# Patient Record
Sex: Female | Born: 1985 | Race: White | Hispanic: No | Marital: Single | Smoking: Current some day smoker
Health system: Southern US, Community
[De-identification: ages and names within clinical notes are randomized; demographics above are authoritative.]

## PROBLEM LIST (undated history)

## (undated) DIAGNOSIS — D689 Coagulation defect, unspecified: Secondary | ICD-10-CM

## (undated) DIAGNOSIS — F419 Anxiety disorder, unspecified: Secondary | ICD-10-CM

## (undated) DIAGNOSIS — A770 Spotted fever due to Rickettsia rickettsii: Secondary | ICD-10-CM

## (undated) DIAGNOSIS — R55 Syncope and collapse: Secondary | ICD-10-CM

## (undated) DIAGNOSIS — R519 Headache, unspecified: Secondary | ICD-10-CM

## (undated) DIAGNOSIS — K759 Inflammatory liver disease, unspecified: Secondary | ICD-10-CM

## (undated) HISTORY — DX: Spotted fever due to Rickettsia rickettsii: A77.0

## (undated) HISTORY — DX: Coagulation defect, unspecified: D68.9

## (undated) HISTORY — DX: Syncope and collapse: R55

## (undated) HISTORY — PX: LIVER BIOPSY: SHX301

---

## 2008-01-21 ENCOUNTER — Other Ambulatory Visit: Payer: Self-pay

## 2008-01-21 ENCOUNTER — Emergency Department: Payer: Self-pay | Admitting: Emergency Medicine

## 2008-03-16 ENCOUNTER — Ambulatory Visit: Payer: Self-pay | Admitting: Internal Medicine

## 2008-03-21 ENCOUNTER — Ambulatory Visit: Payer: Self-pay | Admitting: Family Medicine

## 2008-04-18 ENCOUNTER — Ambulatory Visit: Payer: Self-pay | Admitting: Family Medicine

## 2009-08-29 ENCOUNTER — Ambulatory Visit: Payer: Self-pay | Admitting: Family Medicine

## 2013-03-16 ENCOUNTER — Ambulatory Visit: Payer: Self-pay | Admitting: Family Medicine

## 2013-07-23 ENCOUNTER — Ambulatory Visit: Payer: Self-pay

## 2013-07-23 LAB — DRUG SCREEN, URINE
Benzodiazepine, Ur Scrn: NEGATIVE (ref ?–200)
Cocaine Metabolite,Ur ~~LOC~~: NEGATIVE (ref ?–300)
MDMA (Ecstasy)Ur Screen: NEGATIVE (ref ?–500)
Methadone, Ur Screen: NEGATIVE (ref ?–300)
Opiate, Ur Screen: NEGATIVE (ref ?–300)
Phencyclidine (PCP) Ur S: NEGATIVE (ref ?–25)
Tricyclic, Ur Screen: NEGATIVE (ref ?–1000)

## 2021-09-03 ENCOUNTER — Other Ambulatory Visit: Payer: Self-pay | Admitting: Orthopedic Surgery

## 2021-09-03 DIAGNOSIS — S83511A Sprain of anterior cruciate ligament of right knee, initial encounter: Secondary | ICD-10-CM

## 2021-09-20 ENCOUNTER — Ambulatory Visit
Admission: RE | Admit: 2021-09-20 | Discharge: 2021-09-20 | Disposition: A | Discharge status: Home / Self Care | Payer: Self-pay | Source: Ambulatory Visit | Attending: Orthopedic Surgery | Admitting: Orthopedic Surgery

## 2021-09-20 DIAGNOSIS — S83511A Sprain of anterior cruciate ligament of right knee, initial encounter: Secondary | ICD-10-CM

## 2021-10-23 ENCOUNTER — Other Ambulatory Visit: Payer: Self-pay | Admitting: Orthopedic Surgery

## 2021-11-18 ENCOUNTER — Other Ambulatory Visit: Payer: Self-pay

## 2021-11-18 ENCOUNTER — Other Ambulatory Visit
Admission: RE | Admit: 2021-11-18 | Discharge: 2021-11-18 | Disposition: A | Discharge status: Home / Self Care | Payer: 59 | Source: Ambulatory Visit | Attending: Orthopedic Surgery | Admitting: Orthopedic Surgery

## 2021-11-18 HISTORY — DX: Inflammatory liver disease, unspecified: K75.9

## 2021-11-18 HISTORY — DX: Headache, unspecified: R51.9

## 2021-11-18 HISTORY — DX: Anxiety disorder, unspecified: F41.9

## 2021-11-18 NOTE — Patient Instructions (Addendum)
Your procedure is scheduled on: 11/25/21 - Monday ?Report to the Registration Desk on the 1st floor of the [facility]. ?To find out your arrival time, please call [phone] between 1PM - 3PM on: 11/22/21  ? ?REMEMBER: ?Instructions that are not followed completely may result in serious medical risk, up to and including death; or upon the discretion of your surgeon and anesthesiologist your surgery may need to be rescheduled. ? ?Do not eat food after midnight the night before surgery.  ?No gum chewing, lozengers or hard candies. ? ?You may however, drink CLEAR liquids up to 2 hours before you are scheduled to arrive for your surgery. Do not drink anything within 2 hours of your scheduled arrival time. ? ?Clear liquids include: ?- water  ?- apple juice without pulp ?- gatorade (not RED colors) ?- black coffee or tea (Do NOT add milk or creamers to the coffee or tea) ?Do NOT drink anything that is not on this list ? ?In addition, your doctor has ordered for you to drink the provided  ?Ensure Pre-Surgery Clear Carbohydrate Drink  ?Drinking this carbohydrate drink up to two hours before surgery helps to reduce insulin resistance and improve patient outcomes. Please complete drinking 2 hours prior to scheduled arrival time. ? ?TAKE THESE MEDICATIONS THE MORNING OF SURGERY WITH A SIP OF WATER:  ? ?- escitalopram (LEXAPRO) 5 MG tablet ? ?Stop Anti-inflammatories (NSAIDS) such as Advil, Aleve, Ibuprofen, Motrin, Naproxen, Naprosyn and Aspirin based products such as Excedrin, Goodys Powder, BC Powder. ? ?Stop ANY OVER THE COUNTER supplements until after surgery. ? ?You may take Tylenol if needed for pain up until the day of surgery. ? ?No Alcohol for 24 hours before or after surgery. ? ?No Smoking including e-cigarettes for 24 hours prior to surgery.  ?No chewable tobacco products for at least 6 hours prior to surgery.  ?No nicotine patches on the day of surgery. ? ?Do not use any "recreational" drugs for at least a  week prior to your surgery.  ?Please be advised that the combination of cocaine and anesthesia may have negative outcomes, up to and including death. ?If you test positive for cocaine, your surgery will be cancelled. ? ?On the morning of surgery brush your teeth with toothpaste and water, you may rinse your mouth with mouthwash if you wish. ?Do not swallow any toothpaste or mouthwash. ? ?Use CHG Soap or wipes as directed on instruction sheet. ? ?Do not wear jewelry, make-up, hairpins, clips or nail polish. ? ?Do not wear lotions, powders, or perfumes.  ? ?Do not shave body from the neck down 48 hours prior to surgery just in case you cut yourself which could leave a site for infection.  ?Also, freshly shaved skin may become irritated if using the CHG soap. ? ?Contact lenses, hearing aids and dentures may not be worn into surgery. ? ?Do not bring valuables to the hospital. [facility] is not responsible for any missing/lost belongings or valuables.  ? ?Notify your doctor if there is any change in your medical condition (cold, fever, infection). ? ?Wear comfortable clothing (specific to your surgery type) to the hospital. ? ?After surgery, you can help prevent lung complications by doing breathing exercises.  ?Take deep breaths and cough every 1-2 hours. Your doctor may order a device called an Incentive Spirometer to help you take deep breaths. ?When coughing or sneezing, hold a pillow firmly against your incision with both hands. This is called ?splinting.? Doing this helps protect your incision.  It also decreases belly discomfort. ? ?If you are being admitted to the hospital overnight, leave your suitcase in the car. ?After surgery it may be brought to your room. ? ?If you are being discharged the day of surgery, you will not be allowed to drive home. ?You will need a responsible adult (18 years or older) to drive you home and stay with you that night.  ? ?If you are taking public transportation, you will need to  have a responsible adult (18 years or older) with you. ?Please confirm with your physician that it is acceptable to use public transportation.  ? ?Please call the Pre-admissions Testing Dept. at [phone] if you have any questions about these instructions. ? ?Surgery Visitation Policy: ? ?Patients undergoing a surgery or procedure may have two family members or support persons with them as long as the person is not COVID-19 positive or experiencing its symptoms.  ? ?Inpatient Visitation:   ? ?Visiting hours are 7 a.m. to 8 p.m. ?Up to four visitors are allowed at one time in a patient room, including children. The visitors may rotate out with other people during the day. One designated support person (adult) may remain overnight. ? ?All Areas: ?All visitors must pass COVID-19 screenings, use hand sanitizer when entering and exiting the patient?s room and wear a mask at all times, including in the patient?s room. ?Patients must also wear a mask when staff or their visitor are in the room. ?Masking is required regardless of vaccination status.  ?

## 2021-11-24 MED ORDER — CHLORHEXIDINE GLUCONATE 0.12 % MT SOLN
15.0000 mL | Freq: Once | OROMUCOSAL | Status: AC
  Administered 2021-11-25: 15 mL via OROMUCOSAL

## 2021-11-24 MED ORDER — ORAL CARE MOUTH RINSE: 15.0000 mL | Freq: Once | OROMUCOSAL | Status: AC

## 2021-11-24 MED ORDER — CEFAZOLIN SODIUM-DEXTROSE 2-4 GM/100ML-% IV SOLN
2.0000 g | INTRAVENOUS | Status: AC
  Administered 2021-11-25: 2 g via INTRAVENOUS

## 2021-11-24 MED ORDER — FAMOTIDINE 20 MG PO TABS
20.0000 mg | ORAL_TABLET | Freq: Once | ORAL | Status: AC
  Administered 2021-11-25: 20 mg via ORAL

## 2021-11-24 MED ORDER — LACTATED RINGERS IV SOLN: INTRAVENOUS | Status: DC

## 2021-11-25 ENCOUNTER — Encounter
Admission: RE | Disposition: A | Discharge status: Home / Self Care | Payer: Self-pay | Source: Home / Self Care | Attending: Orthopedic Surgery

## 2021-11-25 ENCOUNTER — Ambulatory Visit: Payer: 59

## 2021-11-25 ENCOUNTER — Ambulatory Visit
Admission: RE | Admit: 2021-11-25 | Discharge: 2021-11-25 | Disposition: A | Discharge status: Home / Self Care | Payer: 59 | Attending: Orthopedic Surgery | Admitting: Orthopedic Surgery

## 2021-11-25 ENCOUNTER — Other Ambulatory Visit: Payer: Self-pay

## 2021-11-25 ENCOUNTER — Ambulatory Visit: Payer: 59 | Admitting: Anesthesiology

## 2021-11-25 ENCOUNTER — Encounter: Payer: Self-pay | Admitting: Orthopedic Surgery

## 2021-11-25 DIAGNOSIS — Z8619 Personal history of other infectious and parasitic diseases: Secondary | ICD-10-CM | POA: Diagnosis not present

## 2021-11-25 DIAGNOSIS — S83511A Sprain of anterior cruciate ligament of right knee, initial encounter: Secondary | ICD-10-CM | POA: Diagnosis present

## 2021-11-25 DIAGNOSIS — X58XXXA Exposure to other specified factors, initial encounter: Secondary | ICD-10-CM | POA: Insufficient documentation

## 2021-11-25 DIAGNOSIS — F419 Anxiety disorder, unspecified: Secondary | ICD-10-CM | POA: Diagnosis not present

## 2021-11-25 DIAGNOSIS — F17200 Nicotine dependence, unspecified, uncomplicated: Secondary | ICD-10-CM | POA: Insufficient documentation

## 2021-11-25 DIAGNOSIS — M898X8 Other specified disorders of bone, other site: Secondary | ICD-10-CM | POA: Insufficient documentation

## 2021-11-25 HISTORY — PX: KNEE ARTHROSCOPY WITH MACI CARTILAGE HARVEST: SHX6875

## 2021-11-25 HISTORY — PX: KNEE ARTHROSCOPY WITH ANTERIOR CRUCIATE LIGAMENT (ACL) REPAIR WITH HAMSTRING GRAFT: SHX5645

## 2021-11-25 LAB — POCT PREGNANCY, URINE: Preg Test, Ur: NEGATIVE

## 2021-11-25 SURGERY — KNEE ARTHROSCOPY WITH ANTERIOR CRUCIATE LIGAMENT (ACL) REPAIR WITH HAMSTRING GRAFT
Anesthesia: General | Laterality: Right

## 2021-11-25 MED ORDER — EPINEPHRINE PF 1 MG/ML IJ SOLN
INTRAMUSCULAR | Status: AC
  Filled 2021-11-25: qty 4

## 2021-11-25 MED ORDER — BUPIVACAINE HCL (PF) 0.5 % IJ SOLN
INTRAMUSCULAR | Status: DC | PRN
  Administered 2021-11-25: 10 mL

## 2021-11-25 MED ORDER — GABAPENTIN 300 MG PO CAPS
ORAL_CAPSULE | ORAL | Status: AC
  Administered 2021-11-25: 600 mg via ORAL
  Filled 2021-11-25: qty 2

## 2021-11-25 MED ORDER — MIDAZOLAM HCL 2 MG/2ML IJ SOLN: 2.0000 mg | Freq: Once | INTRAMUSCULAR | Status: AC

## 2021-11-25 MED ORDER — IBUPROFEN 800 MG PO TABS: 800.0000 mg | ORAL_TABLET | Freq: Three times a day (TID) | ORAL | 1 refills | Status: AC

## 2021-11-25 MED ORDER — FAMOTIDINE 20 MG PO TABS
ORAL_TABLET | ORAL | Status: AC
  Filled 2021-11-25: qty 1

## 2021-11-25 MED ORDER — EPHEDRINE SULFATE (PRESSORS) 50 MG/ML IJ SOLN
INTRAMUSCULAR | Status: DC | PRN
  Administered 2021-11-25: 5 mg via INTRAVENOUS

## 2021-11-25 MED ORDER — ONDANSETRON HCL 4 MG/2ML IJ SOLN
INTRAMUSCULAR | Status: DC | PRN
  Administered 2021-11-25: 4 mg via INTRAVENOUS

## 2021-11-25 MED ORDER — ACETAMINOPHEN 10 MG/ML IV SOLN: 1000.0000 mg | Freq: Once | INTRAVENOUS | Status: DC | PRN

## 2021-11-25 MED ORDER — ACETAMINOPHEN 500 MG PO TABS
ORAL_TABLET | ORAL | Status: AC
  Filled 2021-11-25: qty 2

## 2021-11-25 MED ORDER — MIDAZOLAM HCL 2 MG/2ML IJ SOLN
INTRAMUSCULAR | Status: AC
Start: 2021-11-25 — End: 2021-11-25
  Administered 2021-11-25: 2 mg via INTRAVENOUS
  Filled 2021-11-25: qty 2

## 2021-11-25 MED ORDER — FENTANYL CITRATE (PF) 100 MCG/2ML IJ SOLN
INTRAMUSCULAR | Status: AC
  Filled 2021-11-25: qty 2

## 2021-11-25 MED ORDER — GABAPENTIN 600 MG PO TABS
600.0000 mg | ORAL_TABLET | Freq: Once | ORAL | Status: DC
  Filled 2021-11-25: qty 1

## 2021-11-25 MED ORDER — PROPOFOL 10 MG/ML IV BOLUS
INTRAVENOUS | Status: DC | PRN
  Administered 2021-11-25: 150 mg via INTRAVENOUS

## 2021-11-25 MED ORDER — CELECOXIB 200 MG PO CAPS
200.0000 mg | ORAL_CAPSULE | Freq: Once | ORAL | Status: AC
  Administered 2021-11-25: 200 mg via ORAL

## 2021-11-25 MED ORDER — LIDOCAINE HCL (CARDIAC) PF 100 MG/5ML IV SOSY
PREFILLED_SYRINGE | INTRAVENOUS | Status: DC | PRN
  Administered 2021-11-25: 100 mg via INTRAVENOUS

## 2021-11-25 MED ORDER — OXYCODONE HCL 5 MG PO TABS
ORAL_TABLET | ORAL | Status: AC
  Administered 2021-11-25: 5 mg via ORAL
  Filled 2021-11-25: qty 1

## 2021-11-25 MED ORDER — ONDANSETRON 4 MG PO TBDP: 4.0000 mg | ORAL_TABLET | Freq: Three times a day (TID) | ORAL | 0 refills | Status: DC | PRN

## 2021-11-25 MED ORDER — OXYCODONE HCL 5 MG/5ML PO SOLN: 5.0000 mg | Freq: Once | ORAL | Status: AC | PRN

## 2021-11-25 MED ORDER — FENTANYL CITRATE (PF) 100 MCG/2ML IJ SOLN
INTRAMUSCULAR | Status: DC | PRN
  Administered 2021-11-25 (×2): 50 ug via INTRAVENOUS

## 2021-11-25 MED ORDER — OXYCODONE HCL 5 MG PO TABS: 5.0000 mg | ORAL_TABLET | Freq: Once | ORAL | Status: AC | PRN

## 2021-11-25 MED ORDER — PROMETHAZINE HCL 25 MG/ML IJ SOLN: 6.2500 mg | INTRAMUSCULAR | Status: DC | PRN

## 2021-11-25 MED ORDER — ASPIRIN EC 325 MG PO TBEC: 325.0000 mg | DELAYED_RELEASE_TABLET | Freq: Every day | ORAL | 0 refills | Status: AC

## 2021-11-25 MED ORDER — LIDOCAINE HCL (PF) 1 % IJ SOLN
INTRAMUSCULAR | Status: AC
  Filled 2021-11-25: qty 2

## 2021-11-25 MED ORDER — GABAPENTIN 300 MG PO CAPS: 600.0000 mg | ORAL_CAPSULE | Freq: Once | ORAL | Status: AC

## 2021-11-25 MED ORDER — DIAZEPAM 5 MG PO TABS: 5.0000 mg | ORAL_TABLET | Freq: Three times a day (TID) | ORAL | 0 refills | Status: AC | PRN

## 2021-11-25 MED ORDER — LIDOCAINE HCL (PF) 1 % IJ SOLN
INTRAMUSCULAR | Status: DC | PRN
  Administered 2021-11-25 (×2): 1 mL

## 2021-11-25 MED ORDER — CHLORHEXIDINE GLUCONATE 0.12 % MT SOLN
OROMUCOSAL | Status: AC
  Filled 2021-11-25: qty 15

## 2021-11-25 MED ORDER — CEFAZOLIN SODIUM-DEXTROSE 2-4 GM/100ML-% IV SOLN
INTRAVENOUS | Status: AC
  Filled 2021-11-25: qty 100

## 2021-11-25 MED ORDER — ACETAMINOPHEN 500 MG PO TABS
1000.0000 mg | ORAL_TABLET | Freq: Once | ORAL | Status: AC
  Administered 2021-11-25: 500 mg via ORAL

## 2021-11-25 MED ORDER — OXYCODONE HCL 5 MG PO TABS: 5.0000 mg | ORAL_TABLET | ORAL | 0 refills | Status: DC | PRN

## 2021-11-25 MED ORDER — LIDOCAINE-EPINEPHRINE 1 %-1:100000 IJ SOLN
INTRAMUSCULAR | Status: AC
  Filled 2021-11-25: qty 1

## 2021-11-25 MED ORDER — BUPIVACAINE HCL (PF) 0.5 % IJ SOLN
INTRAMUSCULAR | Status: AC
  Filled 2021-11-25: qty 30

## 2021-11-25 MED ORDER — FENTANYL CITRATE (PF) 100 MCG/2ML IJ SOLN
25.0000 ug | INTRAMUSCULAR | Status: DC | PRN
  Administered 2021-11-25: 50 ug via INTRAVENOUS

## 2021-11-25 MED ORDER — SUGAMMADEX SODIUM 200 MG/2ML IV SOLN
INTRAVENOUS | Status: DC | PRN
  Administered 2021-11-25: 200 mg via INTRAVENOUS

## 2021-11-25 MED ORDER — VANCOMYCIN HCL 1000 MG IV SOLR
INTRAVENOUS | Status: DC | PRN
  Administered 2021-11-25: 1000 mg

## 2021-11-25 MED ORDER — ROCURONIUM BROMIDE 100 MG/10ML IV SOLN
INTRAVENOUS | Status: DC | PRN
  Administered 2021-11-25: 20 mg via INTRAVENOUS
  Administered 2021-11-25: 50 mg via INTRAVENOUS

## 2021-11-25 MED ORDER — EPINEPHRINE PF 1 MG/ML IJ SOLN
INTRAMUSCULAR | Status: DC | PRN
  Administered 2021-11-25: 8002 mL

## 2021-11-25 MED ORDER — VANCOMYCIN HCL 1000 MG IV SOLR
INTRAVENOUS | Status: AC
  Filled 2021-11-25: qty 20

## 2021-11-25 MED ORDER — CELECOXIB 200 MG PO CAPS
ORAL_CAPSULE | ORAL | Status: AC
  Filled 2021-11-25: qty 1

## 2021-11-25 MED ORDER — GABAPENTIN 300 MG PO CAPS: 300.0000 mg | ORAL_CAPSULE | Freq: Three times a day (TID) | ORAL | 0 refills | Status: DC

## 2021-11-25 MED ORDER — ACETAMINOPHEN 500 MG PO TABS: 1000.0000 mg | ORAL_TABLET | Freq: Three times a day (TID) | ORAL | 2 refills | Status: AC

## 2021-11-25 MED ORDER — BUPIVACAINE HCL (PF) 0.5 % IJ SOLN
INTRAMUSCULAR | Status: DC | PRN
  Administered 2021-11-25: 10 mL via PERINEURAL

## 2021-11-25 MED ORDER — DEXAMETHASONE SODIUM PHOSPHATE 10 MG/ML IJ SOLN
INTRAMUSCULAR | Status: DC | PRN
  Administered 2021-11-25: 5 mg via INTRAVENOUS

## 2021-11-25 SURGICAL SUPPLY — 99 items
"PENCIL ELECTRO HAND CTR " (MISCELLANEOUS) ×1 IMPLANT
ADAPTER IRRIG TUBE 2 SPIKE SOL (ADAPTER) ×4 IMPLANT
ADPR TBG 2 SPK PMP STRL ASCP (ADAPTER) ×2
ANCH SUT SWLK 19.1X4.75 (Anchor) ×1 IMPLANT
ANCHOR BUTTON TIGHTROPE RN 14 (Anchor) ×1 IMPLANT
ANCHOR SUT BIO SW 4.75X19.1 (Anchor) ×1 IMPLANT
APL PRP STRL LF DISP 70% ISPRP (MISCELLANEOUS) ×2
BASIN GRAD PLASTIC 32OZ STRL (MISCELLANEOUS) ×2 IMPLANT
BLADE SHAVER 4.5X7 STR FR (MISCELLANEOUS) ×2 IMPLANT
BLADE SURG 15 STRL LF DISP TIS (BLADE) ×2 IMPLANT
BLADE SURG 15 STRL SS (BLADE) ×4
BLADE SURG SZ10 CARB STEEL (BLADE) ×2 IMPLANT
BLADE SURG SZ11 CARB STEEL (BLADE) ×2 IMPLANT
BNDG COHESIVE 4X5 TAN ST LF (GAUZE/BANDAGES/DRESSINGS) ×2 IMPLANT
BNDG COHESIVE 6X5 TAN ST LF (GAUZE/BANDAGES/DRESSINGS) ×2 IMPLANT
BNDG ESMARK 6X12 TAN STRL LF (GAUZE/BANDAGES/DRESSINGS) ×2 IMPLANT
BRUSH SCRUB EZ  4% CHG (MISCELLANEOUS) ×1
BRUSH SCRUB EZ 4% CHG (MISCELLANEOUS) ×1 IMPLANT
BUR BR 5.5 WIDE MOUTH (BURR) IMPLANT
CHLORAPREP W/TINT 26 (MISCELLANEOUS) ×4 IMPLANT
CLEANER CAUTERY TIP 5X5 PAD (MISCELLANEOUS) ×1 IMPLANT
CNTNR SPEC 2.5X3XGRAD LEK (MISCELLANEOUS) ×1
CONT SPEC 4OZ STER OR WHT (MISCELLANEOUS) ×1
CONT SPEC 4OZ STRL OR WHT (MISCELLANEOUS) ×1
CONTAINER SPEC 2.5X3XGRAD LEK (MISCELLANEOUS) IMPLANT
COOLER POLAR GLACIER W/PUMP (MISCELLANEOUS) ×2 IMPLANT
COVER BACK TABLE REUSABLE LG (DRAPES) ×2 IMPLANT
CUFF TOURN SGL QUICK 24 (TOURNIQUET CUFF)
CUFF TOURN SGL QUICK 34 (TOURNIQUET CUFF)
CUFF TRNQT CYL 24X4X16.5-23 (TOURNIQUET CUFF) IMPLANT
CUFF TRNQT CYL 34X4.125X (TOURNIQUET CUFF) IMPLANT
DRAPE 3/4 80X56 (DRAPES) ×4 IMPLANT
DRAPE ARTHRO LIMB 89X125 STRL (DRAPES) ×4 IMPLANT
DRAPE FLUOR MINI C-ARM 54X84 (DRAPES) ×2 IMPLANT
DRAPE IMP U-DRAPE 54X76 (DRAPES) ×2 IMPLANT
DRAPE ORTHO SPLIT 77X108 STRL (DRAPES) ×2
DRAPE POUCH INSTRU U-SHP 10X18 (DRAPES) ×2 IMPLANT
DRAPE SURG ORHT 6 SPLT 77X108 (DRAPES) ×1 IMPLANT
DRILL FLIPCUTTER III 6-12 (ORTHOPEDIC DISPOSABLE SUPPLIES) IMPLANT
ELECT REM PT RETURN 9FT ADLT (ELECTROSURGICAL) ×2
ELECTRODE REM PT RTRN 9FT ADLT (ELECTROSURGICAL) ×1 IMPLANT
FLIPCUTTER III 6-12 AR-1204FF (ORTHOPEDIC DISPOSABLE SUPPLIES) ×2
GAUZE SPONGE 4X4 12PLY STRL (GAUZE/BANDAGES/DRESSINGS) ×2 IMPLANT
GAUZE XEROFORM 1X8 LF (GAUZE/BANDAGES/DRESSINGS) ×2 IMPLANT
GLOVE SRG 8 PF TXTR STRL LF DI (GLOVE) ×1 IMPLANT
GLOVE SURG SYN 8.0 (GLOVE) ×2 IMPLANT
GLOVE SURG SYN 8.0 PF PI (GLOVE) ×1 IMPLANT
GLOVE SURG UNDER POLY LF SZ8 (GLOVE) ×2
GOWN STRL REUS W/ TWL LRG LVL3 (GOWN DISPOSABLE) ×1 IMPLANT
GOWN STRL REUS W/ TWL XL LVL3 (GOWN DISPOSABLE) ×1 IMPLANT
GOWN STRL REUS W/TWL LRG LVL3 (GOWN DISPOSABLE) ×2
GOWN STRL REUS W/TWL XL LVL3 (GOWN DISPOSABLE) ×2
GRADUATE 1200CC STRL 31836 (MISCELLANEOUS) ×2 IMPLANT
GUIDEWIRE 1.2MMX18 (WIRE) ×2 IMPLANT
HANDLE YANKAUER SUCT BULB TIP (MISCELLANEOUS) ×2 IMPLANT
IMP SYS 2ND FIX PEEK 4.75X19.1 (Miscellaneous) IMPLANT
IMPL SYS 2ND FX PEEK 4.75X19.1 (Miscellaneous) IMPLANT
IMPL TIGHTROP ABS ACL FIBERTG (Orthopedic Implant) IMPLANT
IMPL TIGHTROP FIBERTAG ACL (Orthopedic Implant) IMPLANT
IMPL TIGHTROPE ABS ACL FIBERTG (Orthopedic Implant) ×2 IMPLANT
IMPLANT TIGHTROPE FIBERTAG ACL (Orthopedic Implant) ×2 IMPLANT
IV LACTATED RINGER IRRG 3000ML (IV SOLUTION) ×12
IV LR IRRIG 3000ML ARTHROMATIC (IV SOLUTION) ×6 IMPLANT
KIT TRANSTIBIAL (DISPOSABLE) ×1 IMPLANT
KIT TURNOVER KIT A (KITS) ×2 IMPLANT
KNIFE BLADE PARALLEL SZ9 (BLADE) ×1 IMPLANT
MANIFOLD NEPTUNE II (INSTRUMENTS) ×4 IMPLANT
MAT ABSORB  FLUID 56X50 GRAY (MISCELLANEOUS) ×2
MAT ABSORB FLUID 56X50 GRAY (MISCELLANEOUS) ×2 IMPLANT
NEEDLE HYPO 22GX1.5 SAFETY (NEEDLE) ×2 IMPLANT
PACK ARTHROSCOPY KNEE (MISCELLANEOUS) ×2 IMPLANT
PAD ABD DERMACEA PRESS 5X9 (GAUZE/BANDAGES/DRESSINGS) ×6 IMPLANT
PAD CLEANER CAUTERY TIP 5X5 (MISCELLANEOUS) ×1
PAD WRAPON POLAR KNEE (MISCELLANEOUS) ×1 IMPLANT
PENCIL ELECTRO HAND CTR (MISCELLANEOUS) ×2 IMPLANT
PENCIL SMOKE EVACUATOR (MISCELLANEOUS) ×2 IMPLANT
SHAVER BLADE BONE CUTTER  5.5 (BLADE)
SHAVER BLADE BONE CUTTER 5.5 (BLADE) IMPLANT
SPONGE T-LAP 18X18 ~~LOC~~+RFID (SPONGE) ×6 IMPLANT
STRIP CLOSURE SKIN 1/2X4 (GAUZE/BANDAGES/DRESSINGS) ×2 IMPLANT
SUCTION FRAZIER HANDLE 10FR (MISCELLANEOUS)
SUCTION TUBE FRAZIER 10FR DISP (MISCELLANEOUS) IMPLANT
SUT ETHILON 3-0 FS-10 30 BLK (SUTURE) ×2
SUT FIBERSNARE 2 CLSD LOOP (SUTURE) ×1 IMPLANT
SUT FIBERWIRE #2 38 T-5 BLUE (SUTURE) ×4
SUT MNCRL AB 4-0 PS2 18 (SUTURE) ×2 IMPLANT
SUT VIC AB 0 CT1 36 (SUTURE) ×2 IMPLANT
SUT VIC AB 2-0 CT1 27 (SUTURE) ×2
SUT VIC AB 2-0 CT1 TAPERPNT 27 (SUTURE) ×1 IMPLANT
SUTURE EHLN 3-0 FS-10 30 BLK (SUTURE) ×1 IMPLANT
SUTURE FIBERWR #2 38 T-5 BLUE (SUTURE) ×2 IMPLANT
SYR BULB IRRIG 60ML STRL (SYRINGE) ×2 IMPLANT
TOWEL OR 17X26 4PK STRL BLUE (TOWEL DISPOSABLE) ×1 IMPLANT
TRAY FOLEY SLVR 16FR LF STAT (SET/KITS/TRAYS/PACK) ×1 IMPLANT
TUBING INFLOW SET DBFLO PUMP (TUBING) ×2 IMPLANT
TUBING OUTFLOW SET DBLFO PUMP (TUBING) ×2 IMPLANT
WAND WEREWOLF FLOW 90D (MISCELLANEOUS) IMPLANT
WATER STERILE IRR 500ML POUR (IV SOLUTION) ×2 IMPLANT
WRAPON POLAR PAD KNEE (MISCELLANEOUS) ×2

## 2021-11-25 NOTE — Discharge Instructions (Addendum)
Arthroscopic ACL Surgery ?  ?Post-Op Instructions ?  ?1. Bracing or crutches: Crutches will be provided at the time of discharge from the surgery center.  ?  ?2. Ice: You may be provided with a device [facility](Polar Care) that allows you to ice the affected area effectively. Otherwise you can ice manually.  ? ?3. Driving:  Driving: Off all narcotic pain meds when operating vehicle  ? 1 week for automatic cars, left leg surgery ? 2-4 weeks for standard/manual cars or right leg surgery ?  ?4. Activity: Ankle pumps several times an hour while awake to prevent blood clots. Weight bearing: full weight is permitted with brace locked in extension for 1 week. Then brace can be unlocked. Use crutches if there is pain and limping. Bending and straightening the knee is unlimited. Elevate knee above heart level as much as possible for one week. Avoid standing more than 5 minutes (consecutively) for the first week. No exercise involving the knee until cleared by the surgeon or physical therapist. Ideally, you should avoid long distance travel for 4 weeks. ?  ?5. Medications:  ?- You have been provided a prescription for narcotic pain medicine (oxycodone). After surgery, take 1-2 narcotic tablets every 4 hours if needed for severe pain.  ?- A prescription for anti-nausea medication (Zofran) will be provided in case the narcotic medicine causes nausea - take 1 tablet every 6 hours only if nauseated.  ?- Take ibuprofen 800 mg every 8 hours with food to reduce post-operative knee swelling. DO NOT STOP IBUPROFEN POST-OP UNTIL INSTRUCTED TO DO SO at first post-op office visit (10-14 days after surgery).  ?- Take enteric coated aspirin 325 mg once daily for 2 weeks to prevent blood clots.  ?-Take tylenol 1000 mg every 8 hours for pain.  May stop tylenol ~1-2 weeks after surgery if you are having minimal pain. ?- Take gabapentin 300 mg three times daily for 5 days ?- Take Valium 5mg  three times daily as needed x 2 weeks. May stop if not having  any significant muscle spasms. ? ?  ?If you are taking prescription medication for anxiety, depression, insomnia, muscle spasm, chronic pain, or for attention deficit disorder you are advised that you are at a higher risk of adverse effects with use of narcotics post-op, including narcotic addiction/dependence, depressed breathing, death. ?If you use non-prescribed substances: alcohol, marijuana, cocaine, heroin, methamphetamines, etc., you are at a higher risk of adverse effects with use of narcotics post-op, including narcotic addiction/dependence, depressed breathing, death. ?You are advised that taking > 50 morphine milligram equivalents (MME) of narcotic pain medication per day results in twice the risk of overdose or death. For your prescription provided: oxycodone 5 mg - taking more than 6 tablets per day. Be advised that we will prescribe narcotics short-term, for acute post-operative pain only - 1 week for minor operations such as knee arthroscopy for meniscus tear resection, and 3 weeks for major operations such as knee repair/reconstruction surgeries.  ? ?6. Bandages: The physical therapist may change the bandages at the first post-op appointment. If needed, the dressing supplies have been provided to you. ?  ?7. Physical Therapy: 2 times per week for the first 4 weeks, then 1-2 times per week from weeks 4-8 post-op. Therapy typically starts within 1 week. You have been provided an order for physical therapy. The therapist will provide home exercises. Attending physical therapy and performing the appropriate exercises on your own at home daily will determine how well you do after this  surgery. If you do not know when your first physical therapy appointment is, please call the office at [phone]. ?  ?8. Work/School: May return when able to tolerate standing for greater than 2 hours and off of narcotic pain medicaitons ?  ?9. Post-Op Appointments: ?Your first post-op appointment will be with Dr. Allena Katz  in approximately 2 weeks time.  ?  ?If you find that they have not been scheduled please call the Orthopaedic Appointment front desk at [phone]. ?AMBULATORY SURGERY  ?DISCHARGE INSTRUCTIONS ? ? ?The drugs that you were given will stay in your system until tomorrow so for the next 24 hours you should not: ? ?Drive an automobile ?Make any legal decisions ?Drink any alcoholic beverage ? ? ?You may resume regular meals tomorrow.  Today it is better to start with liquids and gradually work up to solid foods. ? ?You may eat anything you prefer, but it is better to start with liquids, then soup and crackers, and gradually work up to solid foods. ? ? ?Please notify your doctor immediately if you have any unusual bleeding, trouble breathing, redness and pain at the surgery site, drainage, fever, or pain not relieved by medication. ? ? ? ?Additional Instructions: ? ? ?Please contact your physician with any problems or Same Day Surgery at [phone], Monday through Friday 6 am to 4 pm, or [place] at [facility] number at [phone].  ? ?POLAR CARE INFORMATION ? ?MassAdvertisement.it ? ?How to use [facility] Therapy System?  YouTube   [email or web address].co.[email or web address] ? ?OPERATING INSTRUCTIONS ? ?Start the product ?With dry hands, connect the transformer to the electrical connection located on the top of the cooler. Next, plug the transformer into an appropriate electrical outlet. The unit will automatically start running at this point. ? ?To stop the pump, disconnect electrical power.  ?Unplug to stop the product when not in use. Unplugging the Polar Care unit turns it off. Always unplug immediately after use. Never leave it plugged in while unattended. Remove pad.  ?  ?FIRST ADD WATER TO FILL LINE, THEN ICE---Replace ice when existing ice is almost melted ? ?1 Discuss Treatment with your Licensed Health Care Practitioner and Use Only as Prescribed ?2 Apply Insulation Barrier & Cold Therapy  Pad ?3 Check for Moisture ?4 Inspect Skin Regularly ? ?Tips and Conservation officer, historic buildings Tips ?1. Use cubed or chunked ice for optimal performance. ?2. It is recommended to drain the Pad between uses. To drain the pad, hold the Pad upright with the hose pointed toward the ground. Depress the black plunger and allow water to drain out. ?3. You may disconnect the Pad from the unit without removing the pad from the affected area by depressing the silver tabs on the hose coupling and gently pulling the hoses apart. The Pad and unit will seal itself and will not leak. Note: Some dripping during release is normal. ?4. DO NOT RUN PUMP WITHOUT WATER! The pump in this unit is designed to run with water. Running the unit without water will cause permanent damage to the pump. ?5. Unplug unit before removing lid. ? ?TROUBLESHOOTING GUIDE ?Pump not running, Water not flowing to the pad, Pad is not getting cold ?1. Make sure the transformer is plugged into the wall outlet. ?2. Confirm that the ice and water are filled to the indicated levels. ?3. Make sure there are no kinks in the pad. ?4. Gently pull on the blue tube to make sure the tube/pad junction is straight. ?5. Remove  the pad from the treatment site and ll it while the pad is lying at; then reapply. ?6. Confirm that the pad couplings are securely attached to the unit. Listen for the double clicks (Figure 1) to confirm the pad couplings are securely attached. ? ?Leaks    Note: Some condensation on the lines, controller, and pads is unavoidable, especially in warmer climates. ?1. If using a [employer] Polar Care Cold Therapy unit with a detachable Cold Therapy Pad, and a leak exists (other than condensation on the lines) disconnect the pad couplings. Make sure the silver tabs on the couplings are depressed before reconnecting the pad to the pump hose; then confirm both sides of the coupling are properly clicked in. ?2. If the coupling continues to leak or a leak is detected in the  pad itself, stop using it and call [employer] Customer Care at [phone]. ? ?Cleaning ?After use, empty and dry the unit with a soft cloth. Warm water and mild detergent may be used occasionally to clea

## 2021-11-25 NOTE — Anesthesia Procedure Notes (Signed)
Anesthesia Regional Block: Adductor canal block  ? ?Pre-Anesthetic Checklist: , timeout performed,  Correct Patient, Correct Site, Correct Laterality,  Correct Procedure, Correct Position, site marked,  Risks and benefits discussed,  Surgical consent,  Pre-op evaluation,  At surgeon's request and post-op pain management ? ?Laterality: Right ? ?Prep: chloraprep     ?  ?Needles:  ?Injection technique: Single-shot ? ?Needle Type: Stimiplex   ? ? ?Needle Length: 9cm  ?Needle Gauge: 22  ? ? ? ?Additional Needles: ? ? ?Procedures:,,,, ultrasound used (permanent image in chart),,    ?Narrative:  ?Start time: 11/25/2021 10:30 AM ?End time: 11/25/2021 10:38 AM ?Injection made incrementally with aspirations every 20 mL. ? ?Performed by: Personally  ?Anesthesiologist: Foye Deer, MD ? ?Additional Notes: ?Patient consented for risk and benefits of nerve block including but not limited to nerve damage, failed block, bleeding and infection.  Patient voiced understanding. ? ?Functioning IV was confirmed and monitors were applied.  Timeout done prior to procedure and prior to any sedation being given to the patient.  Patient confirmed procedure site prior to any sedation given to the patient.  A 27mm 22ga Stimuplex needle was used. Sterile prep,hand hygiene and sterile gloves were used.  Minimal sedation used for procedure.  No paresthesia endorsed by patient during the procedure.  Negative aspiration and negative test dose prior to incremental administration of local anesthetic. The patient tolerated the procedure well with no immediate complications. ? ? ? ?

## 2021-11-25 NOTE — Transfer of Care (Signed)
Immediate Anesthesia Transfer of Care Note ? ?Patient: Jessica Cooley ? ?Procedure(s) Performed: Right arthroscopic ACL reconstruction using quadriceps tendon autograft (Right) ? ?Patient Location: PACU ? ?Anesthesia Type:General ? ?Level of Consciousness: drowsy ? ?Airway & Oxygen Therapy: Patient Spontanous Breathing and Patient connected to face mask oxygen ? ?Post-op Assessment: Report given to RN and Post -op Vital signs reviewed and stable ? ?Post vital signs: Reviewed and stable ? ?Last Vitals:  ?Vitals Value Taken Time  ?BP    ?Temp    ?Pulse    ?Resp    ?SpO2    ? ? ?Last Pain:  ?Vitals:  ? 11/25/21 0905  ?TempSrc: Temporal  ?PainSc: 0-No pain  ?   ? ?  ? ?Complications: No notable events documented. ?

## 2021-11-25 NOTE — H&P (Signed)
Paper H&P to be scanned into permanent record. H&P reviewed. No significant changes noted.  
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
## 2021-11-25 NOTE — Anesthesia Preprocedure Evaluation (Addendum)
Anesthesia Evaluation  ?Patient identified by MRN, date of birth, ID band ?Patient awake ? ? ? ?Reviewed: ?Allergy & Precautions, NPO status , Patient's Chart, lab work & pertinent test results ? ?Airway ?Mallampati: III ? ?TM Distance: >3 FB ?Neck ROM: full ? ? ? Dental ?no notable dental hx. ? ?  ?Pulmonary ?Current Smoker and Patient abstained from smoking.,  ?  ?Pulmonary exam normal ? ? ? ? ? ? ? Cardiovascular ?negative cardio ROS ?Normal cardiovascular exam ? ? ?  ?Neuro/Psych ? Headaches, PSYCHIATRIC DISORDERS Anxiety   ? GI/Hepatic ?negative GI ROS, S/p hep C tx ?  ?Endo/Other  ?negative endocrine ROS ? Renal/GU ?  ? ?  ?Musculoskeletal ? ? Abdominal ?Normal abdominal exam  (+)   ?Peds ? Hematology ?negative hematology ROS ?(+)   ?Anesthesia Other Findings ?Past Medical History: ?No date: Anxiety ?No date: Headache ?    Comment:  occular migraines ?No date: Hepatitis ?    Comment:  C- treatment completed ? ?Past Surgical History: ?No date: LIVER BIOPSY ? ? ? ? Reproductive/Obstetrics ?negative OB ROS ? ?  ? ? ? ? ? ? ? ? ? ? ? ? ? ?  ?  ? ? ? ? ? ? ? ?Anesthesia Physical ?Anesthesia Plan ? ?ASA: 2 ? ?Anesthesia Plan: General ETT  ? ?Post-op Pain Management: Regional block*, Tylenol PO (pre-op)*, Gabapentin PO (pre-op)* and Celebrex PO (pre-op)*  ? ?Induction: Intravenous ? ?PONV Risk Score and Plan: 3 and Ondansetron, Dexamethasone and Midazolam ? ?Airway Management Planned: Oral ETT ? ?Additional Equipment:  ? ?Intra-op Plan:  ? ?Post-operative Plan:  ? ?Informed Consent: I have reviewed the patients History and Physical, chart, labs and discussed the procedure including the risks, benefits and alternatives for the proposed anesthesia with the patient or authorized representative who has indicated his/her understanding and acceptance.  ? ? ? ?Dental advisory given ? ?Plan Discussed with: Anesthesiologist, CRNA and Surgeon ? ?Anesthesia Plan Comments:    ? ? ? ? ? ?Anesthesia Quick Evaluation ? ?

## 2021-11-25 NOTE — Anesthesia Procedure Notes (Signed)
Procedure Name: Intubation ?Date/Time: 11/25/2021 11:05 AM ?Performed by: Philbert Riser, CRNA ?Pre-anesthesia Checklist: Patient identified, Patient being monitored, Timeout performed, Emergency Drugs available and Suction available ?Patient Re-evaluated:Patient Re-evaluated prior to induction ?Oxygen Delivery Method: Circle system utilized ?Preoxygenation: Pre-oxygenation with 100% oxygen ?Induction Type: IV induction ?Ventilation: Mask ventilation without difficulty ?Laryngoscope Size: 3 and McGraph ?Grade View: Grade I ?Tube type: Oral ?Tube size: 7.0 mm ?Number of attempts: 1 ?Airway Equipment and Method: Stylet ?Placement Confirmation: ETT inserted through vocal cords under direct vision, positive ETCO2 and breath sounds checked- equal and bilateral ?Secured at: 21 cm ?Tube secured with: Tape ?Dental Injury: Teeth and Oropharynx as per pre-operative assessment  ? ? ? ? ?

## 2021-11-25 NOTE — Anesthesia Procedure Notes (Signed)
Anesthesia Regional Block: Other [facility])  ? ?Pre-Anesthetic Checklist: , timeout performed,  Correct Patient, Correct Site, Correct Laterality,  Correct Procedure, Correct Position, site marked,  Risks and benefits discussed,  Surgical consent,  Pre-op evaluation,  At surgeon's request and post-op pain management ? ?Laterality: Left ? ?Prep: chloraprep     ?  ?Needles:  ?Injection technique: Single-shot ? ?Needle Type: Stimiplex   ? ? ?Needle Length: 9cm  ?Needle Gauge: 22  ? ? ? ?Additional Needles: ? ? ?Procedures:,,,, ultrasound used (permanent image in chart),,    ?Narrative:  ?Injection made incrementally with aspirations every 20 mL. ? ?Performed by: Personally  ?Anesthesiologist: Foye Deer, MD ? ?Additional Notes: ?Patient consented for risk and benefits of nerve block including but not limited to nerve damage, failed block, bleeding and infection.  Patient voiced understanding. ? ?Functioning IV was confirmed and monitors were applied.  Timeout done prior to procedure and prior to any sedation being given to the patient.  Patient confirmed procedure site prior to any sedation given to the patient.  A 10mm 22ga Stimuplex needle was used. Sterile prep,hand hygiene and sterile gloves were used.  Minimal sedation used for procedure.  No paresthesia endorsed by patient during the procedure.  Negative aspiration and negative test dose prior to incremental administration of local anesthetic. The patient tolerated the procedure well with no immediate complications. ? ? ? ?

## 2021-11-26 ENCOUNTER — Encounter: Payer: Self-pay | Admitting: Orthopedic Surgery

## 2021-11-26 NOTE — Anesthesia Postprocedure Evaluation (Signed)
Anesthesia Post Note ? ?Patient: Jessica Cooley ? ?Procedure(s) Performed: Right arthroscopic ACL reconstruction using quadriceps tendon autograft (Right) ?KNEE ARTHROSCOPY WITH MACI CARTILAGE HARVEST (Right) ? ?Patient location during evaluation: PACU ?Anesthesia Type: General ?Level of consciousness: awake and alert ?Pain management: pain level controlled ?Vital Signs Assessment: post-procedure vital signs reviewed and stable ?Respiratory status: spontaneous breathing, nonlabored ventilation and respiratory function stable ?Cardiovascular status: blood pressure returned to baseline and stable ?Postop Assessment: no apparent nausea or vomiting ?Anesthetic complications: no ? ? ?No notable events documented. ? ? ?Last Vitals:  ?Vitals:  ? 11/25/21 1518 11/25/21 1519  ?BP: (!) 151/107 (!) 149/102  ?Pulse:    ?Resp:    ?Temp:    ?SpO2:    ?  ?Last Pain:  ?Vitals:  ? 11/26/21 0949  ?TempSrc:   ?PainSc: 6   ? ? ?  ?  ?  ?  ?  ?  ? ?Foye Deer ? ? ? ? ?

## 2022-07-16 ENCOUNTER — Other Ambulatory Visit: Payer: Self-pay | Admitting: Orthopedic Surgery

## 2022-07-16 DIAGNOSIS — M25569 Pain in unspecified knee: Secondary | ICD-10-CM

## 2022-07-16 DIAGNOSIS — Z9889 Other specified postprocedural states: Secondary | ICD-10-CM

## 2022-07-16 DIAGNOSIS — M2351 Chronic instability of knee, right knee: Secondary | ICD-10-CM

## 2022-07-25 ENCOUNTER — Ambulatory Visit
Admission: RE | Admit: 2022-07-25 | Discharge: 2022-07-25 | Disposition: A | Discharge status: Home / Self Care | Payer: 59 | Source: Ambulatory Visit | Attending: Orthopedic Surgery | Admitting: Orthopedic Surgery

## 2022-07-25 DIAGNOSIS — M25569 Pain in unspecified knee: Secondary | ICD-10-CM

## 2022-07-25 DIAGNOSIS — M2351 Chronic instability of knee, right knee: Secondary | ICD-10-CM

## 2022-07-25 DIAGNOSIS — Z9889 Other specified postprocedural states: Secondary | ICD-10-CM

## 2022-08-22 ENCOUNTER — Ambulatory Visit
Admission: RE | Admit: 2022-08-22 | Discharge: 2022-08-22 | Disposition: A | Discharge status: Home / Self Care | Payer: 59 | Source: Ambulatory Visit

## 2022-08-22 VITALS — BP 107/77 | HR 93 | Temp 98.1°F | Resp 14 | Ht 64.0 in | Wt 140.0 lb

## 2022-08-22 DIAGNOSIS — R21 Rash and other nonspecific skin eruption: Secondary | ICD-10-CM

## 2022-08-22 MED ORDER — PREDNISONE 10 MG (21) PO TBPK: ORAL_TABLET | ORAL | 0 refills | Status: DC

## 2022-08-22 MED ORDER — DOXYCYCLINE HYCLATE 100 MG PO CAPS: 100.0000 mg | ORAL_CAPSULE | Freq: Two times a day (BID) | ORAL | 0 refills | Status: DC

## 2022-08-22 NOTE — ED Provider Notes (Signed)
[facility]    CSN: [id number] Arrival date & time: 08/22/22  1817      History   Chief Complaint Chief Complaint  Patient presents with   Rash    Appointment    HPI Jessica Cooley is a 36 y.o. female.   HPI  36 year old female here for evaluation of foot rash.  The patient reports that she has been experiencing redness to the soles of her feet for the past week.  She states 3 to 4 days ago she noticed what she thought might be pus so she came in to be evaluated.  She is due to have ACL surgery and she is concerned this might interfere.  She has tried several psoriasis creams and hydrocortisone to see if it would help without any significant improvement.  She denies any fever or swelling of her feet.  She has some similar lesions on the palm of her left hand.  The same time.  She denies any pain and states that there is very minimal itching.  Past Medical History:  Diagnosis Date   Anxiety    Headache    occular migraines   Hepatitis    C- treatment completed    There are no problems to display for this patient.   Past Surgical History:  Procedure Laterality Date   KNEE ARTHROSCOPY WITH ANTERIOR CRUCIATE LIGAMENT (ACL) REPAIR WITH HAMSTRING GRAFT Right 11/25/2021   Procedure: Right arthroscopic ACL reconstruction using quadriceps tendon autograft;  Surgeon: Signa Kell, MD;  Location: [facility] ORS;  Service: Orthopedics;  Laterality: Right;   KNEE ARTHROSCOPY WITH MACI CARTILAGE HARVEST Right 11/25/2021   Procedure: KNEE ARTHROSCOPY WITH MACI CARTILAGE HARVEST;  Surgeon: Signa Kell, MD;  Location: [facility] ORS;  Service: Orthopedics;  Laterality: Right;   LIVER BIOPSY      OB History   No obstetric history on file.      Home Medications    Prior to Admission medications   Medication Sig Start Date End Date Taking? Authorizing Provider  doxycycline (VIBRAMYCIN) 100 MG capsule Take 1 capsule (100 mg total) by mouth 2 (two) times daily. 08/22/22  Yes Becky Augusta, NP  predniSONE (STERAPRED UNI-PAK 21 TAB) 10 MG (21) TBPK tablet Take 6 tablets on day 1, 5 tablets day 2, 4 tablets day 3, 3 tablets day 4, 2 tablets day 5, 1 tablet day 6 08/22/22  Yes Becky Augusta, NP  promethazine (PHENERGAN) 25 MG tablet TAKE 1 TABLET BY MOUTH TWICE A DAY AS NEEDED NAUSEA/VOMITING 08/01/22  Yes [provider]  propranolol (INDERAL) 10 MG tablet TAKE 1 TABLET BY MOUTH THREE TIMES A DAY AS NEEDED AS NEEDED FOR ANXIETY 08/01/22  Yes [provider]  traMADol (ULTRAM) 50 MG tablet Take by mouth. 07/15/22  Yes [provider]  acetaminophen (TYLENOL) 500 MG tablet Take 2 tablets (1,000 mg total) by mouth every 8 (eight) hours. 11/25/21 11/25/22  Signa Kell, MD  diazepam (VALIUM) 5 MG tablet Take 1 tablet (5 mg total) by mouth every 8 (eight) hours as needed for muscle spasms. 11/25/21 11/25/22  Signa Kell, MD  EPINEPHrine 0.3 mg/0.3 mL IJ SOAJ injection Inject 0.3 mg into the muscle as needed for anaphylaxis.    [provider]  meloxicam (MOBIC) 7.5 MG tablet Take 7.5 mg by mouth daily.    [provider]  ondansetron (ZOFRAN-ODT) 4 MG disintegrating tablet Take 1 tablet (4 mg total) by mouth every 8 (eight) hours as needed for nausea or vomiting. 11/25/21  Signa Kell, MD    Family History History reviewed. No pertinent family history.  Social History Social History   Tobacco Use   Smoking status: Some Days    Types: Cigarettes    Passive exposure: Never   Smokeless tobacco: Never  Vaping Use   Vaping Use: Never used  Substance Use Topics   Alcohol use: Yes    Comment: social   Drug use: Never     Allergies   Bee venom   Review of Systems Review of Systems  Constitutional:  Negative for fever.  Skin:  Positive for color change and rash.  Hematological: Negative.   Psychiatric/Behavioral: Negative.       Physical Exam Triage Vital Signs ED Triage Vitals  Enc Vitals Group     BP 08/22/22 1848 107/77      Pulse Rate 08/22/22 1848 93     Resp 08/22/22 1848 14     Temp 08/22/22 1848 98.1 F (36.7 C)     Temp Source 08/22/22 1848 Oral     SpO2 08/22/22 1848 97 %     Weight 08/22/22 1845 140 lb (63.5 kg)     Height 08/22/22 1845 5\' 4"  (1.626 m)     Head Circumference --      Peak Flow --      Pain Score 08/22/22 1844 5     Pain Loc --      Pain Edu? --      Excl. in GC? --    No data found.  Updated Vital Signs BP 107/77 (BP Location: Right Arm)   Pulse 93   Temp 98.1 F (36.7 C) (Oral)   Resp 14   Ht 5\' 4"  (1.626 m)   Wt 140 lb (63.5 kg)   LMP 07/25/2022 (Approximate)   SpO2 97%   BMI 24.03 kg/m   Visual Acuity Right Eye Distance:   Left Eye Distance:   Bilateral Distance:    Right Eye Near:   Left Eye Near:    Bilateral Near:     Physical Exam Vitals and nursing note reviewed.  Constitutional:      Appearance: Normal appearance. She is not ill-appearing.  Skin:    General: Skin is warm and dry.     Capillary Refill: Capillary refill takes less than 2 seconds.     Findings: Erythema and rash present.  Neurological:     General: No focal deficit present.     Mental Status: She is alert and oriented to person, place, and time.  Psychiatric:        Mood and Affect: Mood normal.        Behavior: Behavior normal.        Thought Content: Thought content normal.        Judgment: Judgment normal.      UC Treatments / Results  Labs (all labs ordered are listed, but only abnormal results are displayed) Labs Reviewed - No data to display  EKG   Radiology No results found.  Procedures Procedures (including critical care time)  Medications Ordered in UC Medications - No data to display  Initial Impression / Assessment and Plan / UC Course  I have reviewed the triage vital signs and the nursing notes.  Pertinent labs & imaging results that were available during my care of the patient were reviewed by me and considered in my medical decision making (see  chart for details).   Patient is a nontoxic-appearing 36 year old female here for evaluation of a  rash on the soles of both of her feet and also on the palm of her left hand that started approximately a week ago and worsened 3 to 4 days ago.          As you can see the image above the lesion in the central part of her hand appears to be a normal examination.  To the proximal and lateral aspect there are a series of red dots in a cluster.  There is a similar appearance to the soles of the feet with more prominent desquamation of the skin on the left foot than the right.  The etiology of the lesions is unclear though the hand looks like an eczema lesion.  The feet may very well be eczema lesions but is concerning given the amount of desquamation and also the patient report of pus drainage.  Another possibility could be large plantar wart though the area is not hard and it is not painful.  I will treat the patient for potential infectious etiology with doxycycline 1 mg twice daily for 10 days and also a prednisone taper to help with inflammation.  I will also refer the patient to podiatry for further evaluation.   Final Clinical Impressions(s) / UC Diagnoses   Final diagnoses:  Rash and nonspecific skin eruption     Discharge Instructions      The cause of your lesions is unclear though with your description of pus drainage it is concerning that there is a potential infectious origin.  Take the doxycycline twice daily with food for 10 days for treatment of possible skin infection.  Take the prednisone daily at breakfast time starting tomorrow morning to help with inflammation.  I am going to refer you to podiatry for further evaluation and definitive treatment.     ED Prescriptions     Medication Sig Dispense Auth. Provider   doxycycline (VIBRAMYCIN) 100 MG capsule Take 1 capsule (100 mg total) by mouth 2 (two) times daily. 20 capsule Becky Augusta, NP   predniSONE (STERAPRED  UNI-PAK 21 TAB) 10 MG (21) TBPK tablet Take 6 tablets on day 1, 5 tablets day 2, 4 tablets day 3, 3 tablets day 4, 2 tablets day 5, 1 tablet day 6 21 tablet Becky Augusta, NP      PDMP not reviewed this encounter.   Becky Augusta, NP 08/22/22 1935

## 2022-08-22 NOTE — Discharge Instructions (Addendum)
The cause of your lesions is unclear though with your description of pus drainage it is concerning that there is a potential infectious origin.  Take the doxycycline twice daily with food for 10 days for treatment of possible skin infection.  Take the prednisone daily at breakfast time starting tomorrow morning to help with inflammation.  I am going to refer you to podiatry for further evaluation and definitive treatment.

## 2022-08-22 NOTE — ED Triage Notes (Signed)
Patient reports peeling skin and redness on the bottom of her feet for the past week.  Patient denies any itching.

## 2022-09-01 ENCOUNTER — Ambulatory Visit (INDEPENDENT_AMBULATORY_CARE_PROVIDER_SITE_OTHER): Payer: 59 | Admitting: Podiatry

## 2022-09-01 DIAGNOSIS — L403 Pustulosis palmaris et plantaris: Secondary | ICD-10-CM | POA: Diagnosis not present

## 2022-09-01 MED ORDER — CLOBETASOL PROPIONATE 0.05 % EX OINT
1.0000 | TOPICAL_OINTMENT | Freq: Two times a day (BID) | CUTANEOUS | 0 refills | Status: DC
Start: 2022-09-01 — End: 2023-07-27

## 2022-09-02 ENCOUNTER — Encounter: Payer: Self-pay | Admitting: Podiatry

## 2022-09-02 NOTE — Progress Notes (Signed)
  Subjective:  Patient ID: Jessica Cooley, female    DOB: [birth year],  MRN: [id number]  Chief Complaint  Patient presents with   Blister    (np) bil blisters, infected, bottom    37 y.o. female presents with the above complaint. History confirmed with patient.  Many years ago she had severe psoriasis and had taken medication for this  Objective:  Physical Exam: warm, good capillary refill, no trophic changes or ulcerative lesions, normal DP and PT pulses, normal sensory exam, and pustular plaque consistent with psoriasis multiple areas worse on the left foot than the right foot, she has it on her hands as well  Assessment:   1. Acute palmoplantar pustular psoriasis      Plan:  Patient was evaluated and treated and all questions answered.  Discussed with her that this is likely pustular psoriasis.  I recommended treatment with topical steroid and Temovate ointment was sent to her pharmacy.  She does not need to complete the prednisone taper, she should finish the doxycycline to prevent any secondary infection which I think she had likely a week ago when she went to urgent care based on the photos.  She is an upcoming appointment with dermatology and will follow-up with them for further treatment.  She will return to see me as needed  Return if symptoms worsen or fail to improve.

## 2023-03-23 IMAGING — MR MR KNEE*R* W/O CM
7 series · 40 of 40 positions shown · non-contrast
Comparison: None.

CLINICAL DATA: Right lateral knee pain. Injured 1 month ago.

EXAM:
MRI OF THE RIGHT KNEE WITHOUT CONTRAST
TECHNIQUE: Multiplanar, multisequence MR imaging of the knee was performed. No
intravenous contrast was administered.

[Series 3: T2 fat-sat · axial · right · 4.0mm · 0.50mm/px · z∈[-70,+84]mm · 8 of 36 slices shown (1 of 3)]
[im 1/36]
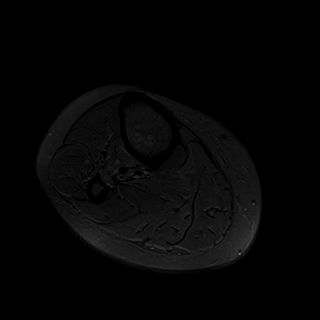
[im 6/36]
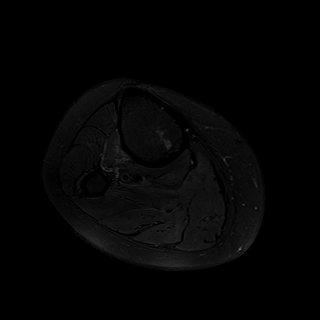
[im 11/36]
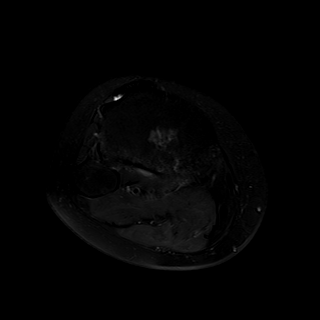
[im 16/36]
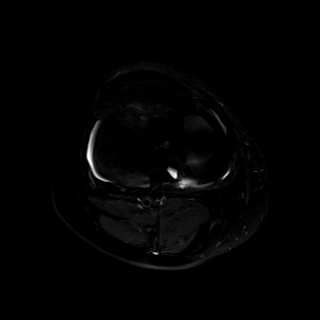
[im 21/36]
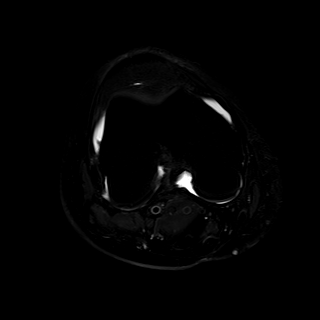
[im 26/36]
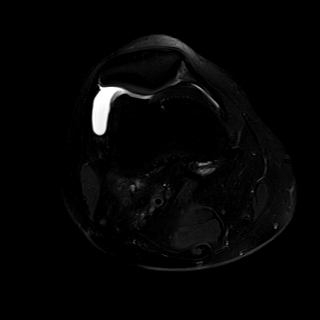
[im 31/36]
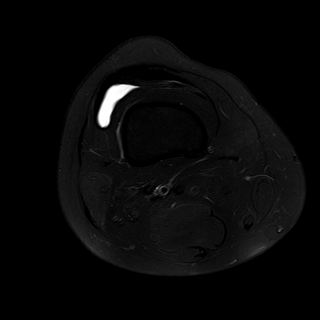
[im 36/36]
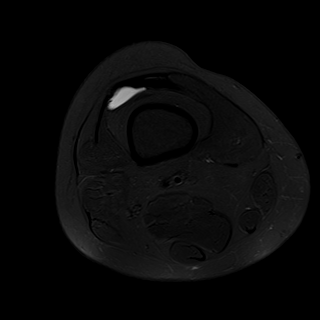

[Series 4: T2 fat-sat · coronal · right · 4.0mm · 0.47mm/px · 5 of 25 slices shown (2 of 3)]
[im 1/25]
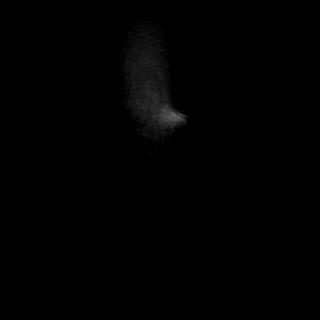
[im 7/25]
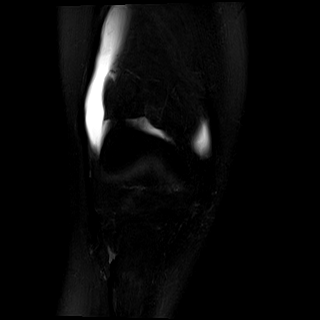
[im 13/25]
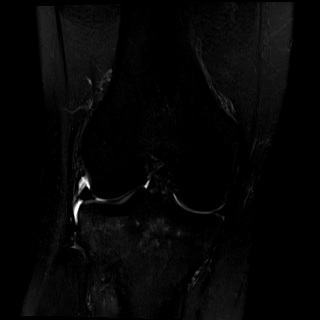
[im 19/25]
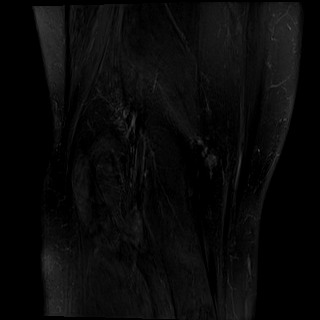
[im 25/25]
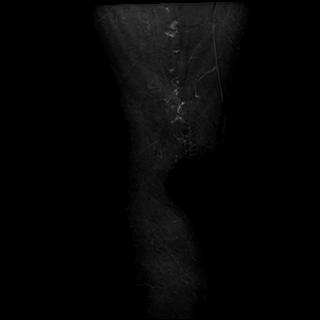

[Series 5: T1 · coronal · right · 4.0mm · 0.47mm/px · 5 of 25 slices shown]
[im 1/25]
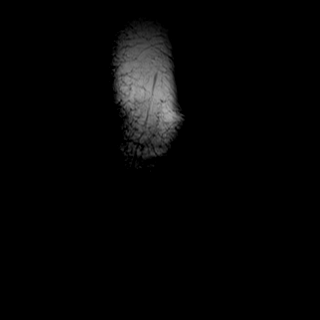
[im 7/25]
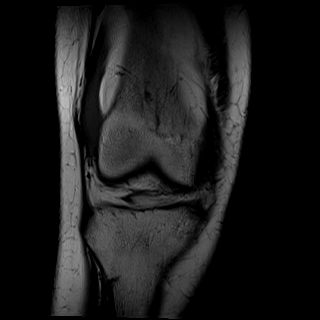
[im 13/25]
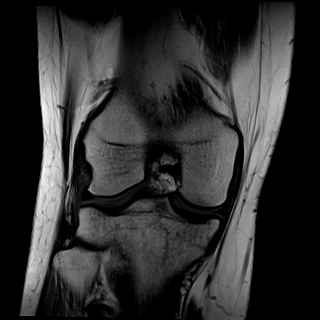
[im 19/25]
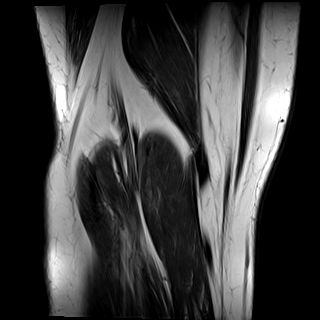
[im 25/25]
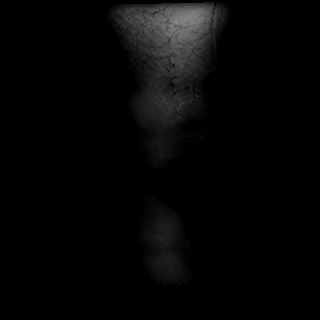

[Series 6: PD fat-sat · coronal · right · 3.0mm · 0.47mm/px · 6 of 28 slices shown (1 of 2)]
[im 1/28]
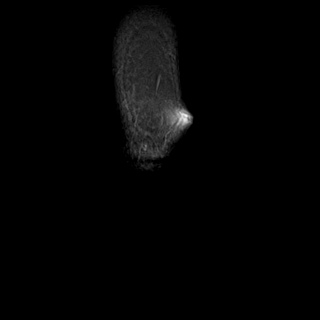
[im 6/28]
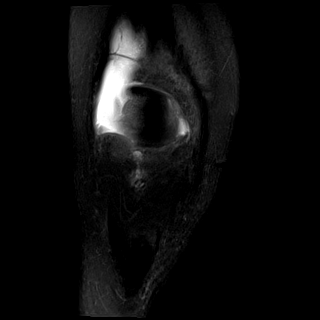
[im 11/28]
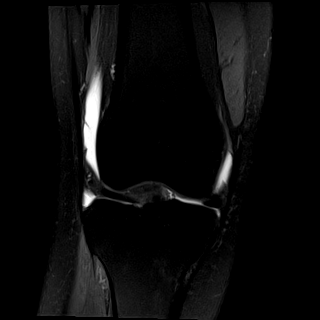
[im 17/28]
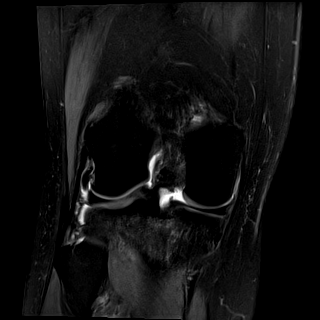
[im 22/28]
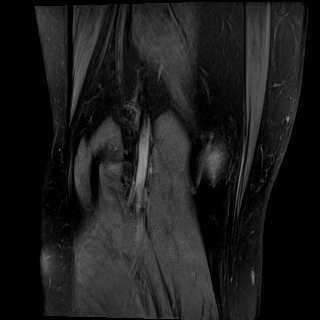
[im 28/28]
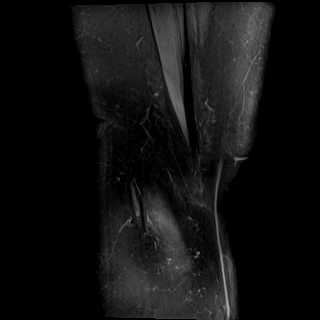

[Series 7: PD fat-sat · sagittal · right · 3.0mm · 0.39mm/px · 6 of 27 slices shown (2 of 2)]
[im 1/27]
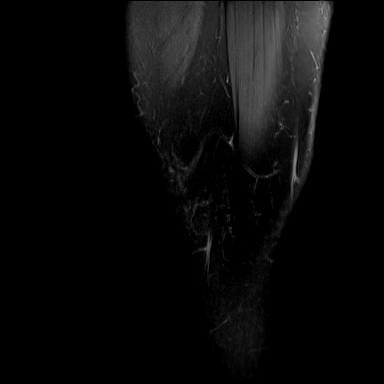
[im 6/27]
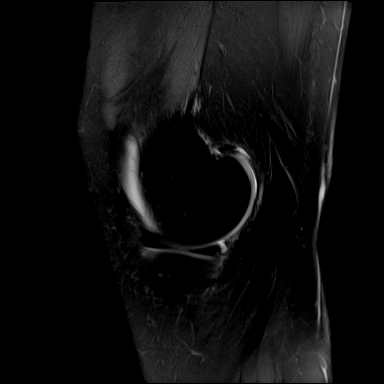
[im 11/27]
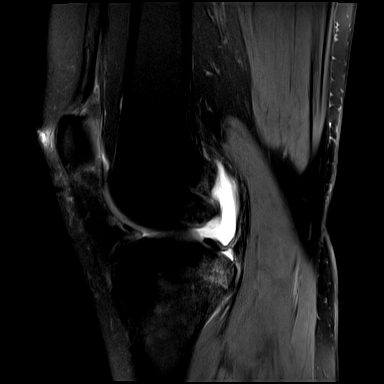
[im 16/27]
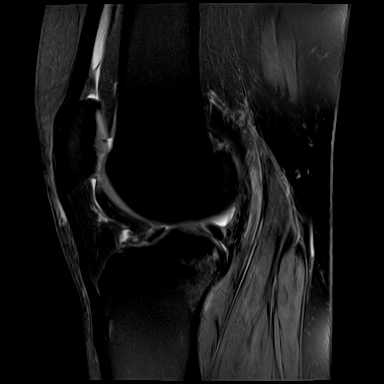
[im 21/27]
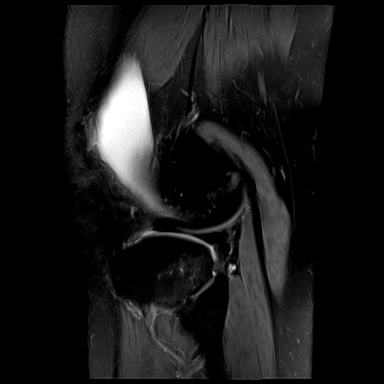
[im 27/27]
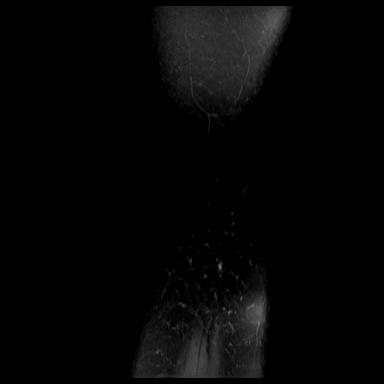

[Series 8: T2 fat-sat · sagittal · right · 3.0mm · 0.39mm/px · 6 of 27 slices shown (3 of 3)]
[im 1/27]
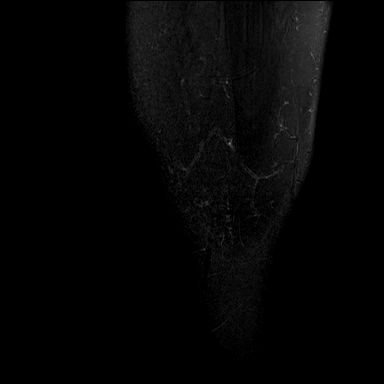
[im 6/27]
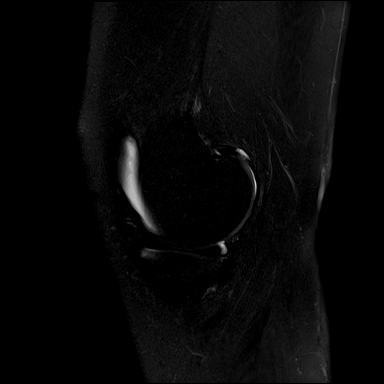
[im 11/27]
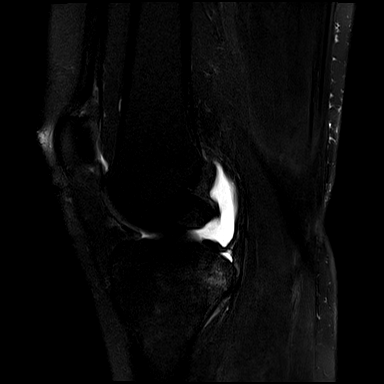
[im 16/27]
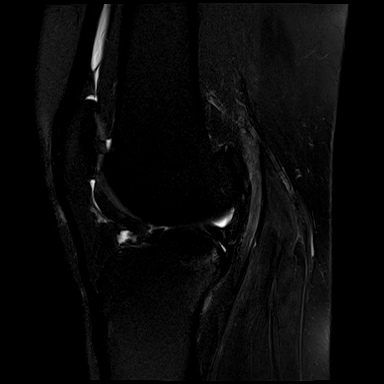
[im 21/27]
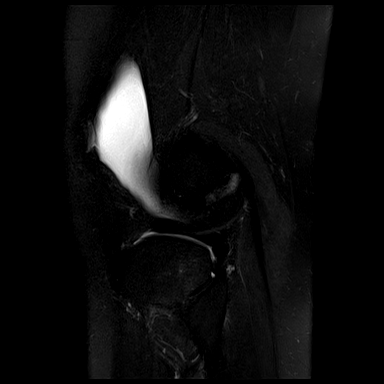
[im 27/27]
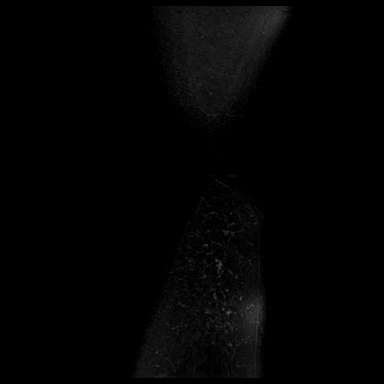

[Series 9: PD · oblique · right · 1.5mm · 0.44mm/px · 4 of 21 slices shown]
[im 1/21]
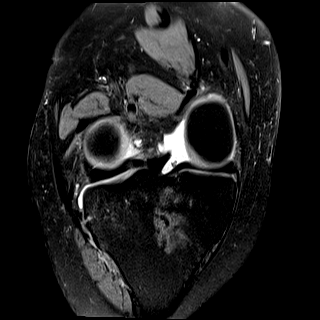
[im 7/21]
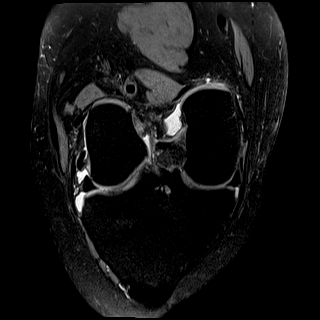
[im 14/21]
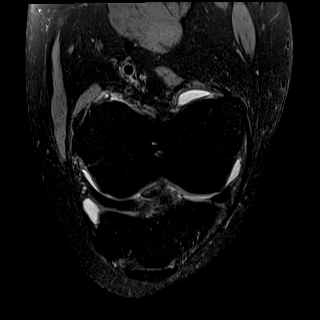
[im 21/21]
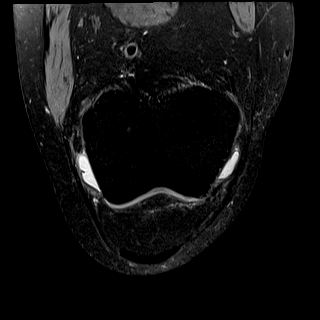

[40 of 40 positions shown; findings below may reference images not displayed]

FINDINGS: MENISCI

Medial: Intact.

Lateral: Intact.

LIGAMENTS

Cruciates: Complete ACL tear. Intact PCL.

Collaterals: Medial collateral ligament is intact. Lateral
collateral ligament complex is intact.

CARTILAGE

Patellofemoral:  No chondral defect.

Medial:  No chondral defect.

Lateral:  No chondral defect.

JOINT: Small joint effusion. Normal Lojaine Loutfy. Prominent
medial patellar plica. Joint capsule is intact.

POPLITEAL FOSSA: Popliteus tendon is intact. No Baker's cyst.

EXTENSOR MECHANISM: Intact quadriceps tendon. Intact patellar
tendon. Intact lateral patellar retinaculum. Intact medial patellar
retinaculum. Intact MPFL.

BONES: No aggressive osseous lesion. No fracture or dislocation.
Osseous contusions of the posterolateral tibial plateau and
posteromedial tibial plateau.

Other: No fluid collection or hematoma. Muscles are normal.
IMPRESSION: 1. Complete ACL tear.
2. No meniscal injury of the right knee.
3. Osseous contusions of the posterolateral tibial plateau and
posteromedial tibial plateau.

## 2023-04-14 ENCOUNTER — Ambulatory Visit
Admission: RE | Admit: 2023-04-14 | Discharge: 2023-04-14 | Disposition: A | Discharge status: Home / Self Care | Payer: BC Managed Care – PPO | Source: Ambulatory Visit | Attending: Emergency Medicine | Admitting: Emergency Medicine

## 2023-04-14 ENCOUNTER — Ambulatory Visit (INDEPENDENT_AMBULATORY_CARE_PROVIDER_SITE_OTHER): Payer: BC Managed Care – PPO

## 2023-04-14 VITALS — BP 121/90 | HR 66 | Temp 96.7°F | Resp 18

## 2023-04-14 DIAGNOSIS — S42401A Unspecified fracture of lower end of right humerus, initial encounter for closed fracture: Secondary | ICD-10-CM | POA: Diagnosis not present

## 2023-04-14 DIAGNOSIS — M778 Other enthesopathies, not elsewhere classified: Secondary | ICD-10-CM | POA: Diagnosis not present

## 2023-04-14 DIAGNOSIS — M25521 Pain in right elbow: Secondary | ICD-10-CM

## 2023-04-14 MED ORDER — MELOXICAM 7.5 MG PO TABS: 7.5000 mg | ORAL_TABLET | Freq: Every day | ORAL | 0 refills | Status: AC

## 2023-04-14 NOTE — ED Provider Notes (Signed)
[facility]    CSN: [id number] Arrival date & time: 04/14/23  1006      History   Chief Complaint Chief Complaint  Patient presents with   Arm Injury    Entered by patient    HPI Jessica Cooley is a 37 y.o. female.   37 year old female, Jessica Cooley, presents to urgent care for evaluation of right elbow pain and clicking.  Patient states she was recently moving and assembling a dresser and felt pain in her elbow.  Patient has history of prior elbow fracture.  The history is provided by the patient. No language interpreter was used.    Past Medical History:  Diagnosis Date   Anxiety    Headache    occular migraines   Hepatitis    C- treatment completed    Patient Active Problem List   Diagnosis Date Noted   Right elbow pain 04/14/2023   Tendonitis of elbow, right 04/14/2023   Occult closed fracture of right elbow 04/14/2023    Past Surgical History:  Procedure Laterality Date   KNEE ARTHROSCOPY WITH ANTERIOR CRUCIATE LIGAMENT (ACL) REPAIR WITH HAMSTRING GRAFT Right 11/25/2021   Procedure: Right arthroscopic ACL reconstruction using quadriceps tendon autograft;  Surgeon: Signa Kell, MD;  Location: [facility];  Service: Orthopedics;  Laterality: Right;   KNEE ARTHROSCOPY WITH MACI CARTILAGE HARVEST Right 11/25/2021   Procedure: KNEE ARTHROSCOPY WITH MACI CARTILAGE HARVEST;  Surgeon: Signa Kell, MD;  Location: [facility];  Service: Orthopedics;  Laterality: Right;   LIVER BIOPSY      OB History   No obstetric history on file.      Home Medications    Prior to Admission medications   Medication Sig Start Date End Date Taking? Authorizing Provider  clobetasol ointment (TEMOVATE) 0.05 % Apply 1 Application topically 2 (two) times daily. 09/01/22   McDonald, Rachelle Hora, DPM  doxycycline (VIBRAMYCIN) 100 MG capsule Take 1 capsule (100 mg total) by mouth 2 (two) times daily. 08/22/22   Becky Augusta, NP  EPINEPHrine 0.3 mg/0.3 mL IJ SOAJ injection Inject 0.3  mg into the muscle as needed for anaphylaxis.    [provider]  meloxicam (MOBIC) 7.5 MG tablet Take 1 tablet (7.5 mg total) by mouth daily for 7 days. 04/14/23 04/21/23  Laasya Peyton, Para March, NP  ondansetron (ZOFRAN-ODT) 4 MG disintegrating tablet Take 1 tablet (4 mg total) by mouth every 8 (eight) hours as needed for nausea or vomiting. 11/25/21   Signa Kell, MD  predniSONE (STERAPRED UNI-PAK 21 TAB) 10 MG (21) TBPK tablet Take 6 tablets on day 1, 5 tablets day 2, 4 tablets day 3, 3 tablets day 4, 2 tablets day 5, 1 tablet day 6 08/22/22   Becky Augusta, NP  promethazine (PHENERGAN) 25 MG tablet TAKE 1 TABLET BY MOUTH TWICE A DAY AS NEEDED NAUSEA/VOMITING 08/01/22   [provider]  propranolol (INDERAL) 10 MG tablet TAKE 1 TABLET BY MOUTH THREE TIMES A DAY AS NEEDED AS NEEDED FOR ANXIETY 08/01/22   [provider]  traMADol Janean Sark) 50 MG tablet Take by mouth. 07/15/22   [provider]    Family History No family history on file.  Social History Social History   Tobacco Use   Smoking status: Some Days    Types: Cigarettes    Passive exposure: Never   Smokeless tobacco: Never  Vaping Use   Vaping status: Never Used  Substance Use Topics   Alcohol use: Yes    Comment: social  Drug use: Never     Allergies   Bee venom   Review of Systems Review of Systems  Musculoskeletal:  Positive for arthralgias, joint swelling and myalgias.  All other systems reviewed and are negative.    Physical Exam Triage Vital Signs ED Triage Vitals  Encounter Vitals Group     BP 04/14/23 1020 (!) 121/90     Systolic BP Percentile --      Diastolic BP Percentile --      Pulse Rate 04/14/23 1020 66     Resp 04/14/23 1020 18     Temp 04/14/23 1024 (!) 96.7 F (35.9 C)     Temp src --      SpO2 04/14/23 1020 100 %     Weight --      Height --      Head Circumference --      Peak Flow --      Pain Score --      Pain Loc --      Pain Education --       Exclude from Growth Chart --    No data found.  Updated Vital Signs BP (!) 121/90   Pulse 66   Temp (!) 96.7 F (35.9 C)   Resp 18   SpO2 100%   Visual Acuity Right Eye Distance:   Left Eye Distance:   Bilateral Distance:    Right Eye Near:   Left Eye Near:    Bilateral Near:     Physical Exam Vitals and nursing note reviewed.  Constitutional:      General: She is not in acute distress.    Appearance: She is well-developed and well-groomed.  HENT:     Head: Normocephalic and atraumatic.  Eyes:     Conjunctiva/sclera: Conjunctivae normal.  Cardiovascular:     Rate and Rhythm: Normal rate and regular rhythm.     Heart sounds: No murmur heard. Pulmonary:     Effort: Pulmonary effort is normal. No respiratory distress.     Breath sounds: Normal breath sounds.  Abdominal:     Palpations: Abdomen is soft.     Tenderness: There is no abdominal tenderness.  Musculoskeletal:        General: No swelling.     Right elbow: Swelling present. Tenderness present in radial head.     Cervical back: Neck supple.  Skin:    General: Skin is warm and dry.     Capillary Refill: Capillary refill takes less than 2 seconds.  Neurological:     General: No focal deficit present.     Mental Status: She is alert and oriented to person, place, and time.     GCS: GCS eye subscore is 4. GCS verbal subscore is 5. GCS motor subscore is 6.     Cranial Nerves: No cranial nerve deficit.     Sensory: No sensory deficit.  Psychiatric:        Attention and Perception: Attention normal.        Mood and Affect: Mood normal.        Speech: Speech normal.        Behavior: Behavior normal. Behavior is cooperative.      UC Treatments / Results  Labs (all labs ordered are listed, but only abnormal results are displayed) Labs Reviewed - No data to display  EKG   Radiology DG Elbow Complete Right  Result Date: 04/14/2023 CLINICAL DATA:  Right elbow pain.  Possible fracture re-injury. EXAM:  RIGHT ELBOW -  COMPLETE 3+ VIEW COMPARISON:  None Available. FINDINGS: Mild irregularity of the radial head is attributable to spurring. Mild spur like irregularity along the posteromedial distal humeral metaphysis possibly from an old injury. Linear calcification adjacent to the coronoid process on the AP view appears chronic. There is mild spurring of the coronoid process. There is some mild anterior bulging of the supinator fat pad, which can sometimes be seen in the setting of radial head fracture. IMPRESSION: 1. Mild anterior bulging of the supinator fat pad, which can sometimes be seen in the setting of radial head fracture. There is mild radial head irregularity more attributable to spurring than fracture, although radial head fracture can be subtle or occult at radiography. Consider dedicated elbow MRI or CT. 2. Mild spurring of the radial head and coronoid process. 3. Mild spur like irregularity along the posteromedial distal humeral metaphysis possibly from an old injury. Electronically Signed   By: Gaylyn Rong M.D.   On: 04/14/2023 12:20    Procedures Procedures (including critical care time)  Medications Ordered in UC Medications - No data to display  Initial Impression / Assessment and Plan / UC Course  I have reviewed the triage vital signs and the nursing notes.  Pertinent labs & imaging results that were available during my care of the patient were reviewed by me and considered in my medical decision making (see chart for details).  Clinical Course as of 04/14/23 2043  Tue Apr 14, 2023  1148 Right elbow xray,right hand dominant previous fx reinjured 1 week prior w dresser assembly and movement. [JD]  1238 Sugar tong splint ordered for right elbow possible occult fracture and sling [JD]    Clinical Course User Index [JD] Olean Sangster, Para March, NP   Splint applied for possible occult fracture, reviewed results with patient and plan of care.  Patient verbalized understanding to  this provider, referred to orthopedics.  Ddx: Right elbow pain, occult closed fracture of radial head, tendinitis Final Clinical Impressions(s) / UC Diagnoses   Final diagnoses:  Right elbow pain  Tendonitis of elbow, right  Occult closed fracture of right elbow, initial encounter     Discharge Instructions      Your xray is inconclusive, may have an occult radial head fracture.  We are splinting your elbow until you can be seen by orthopedics. Rest,ice, take meloxicam as prescribed. Follow up with Orthopedics for MRI/CT,further management.    ED Prescriptions     Medication Sig Dispense Auth. Provider   meloxicam (MOBIC) 7.5 MG tablet Take 1 tablet (7.5 mg total) by mouth daily for 7 days. 7 tablet Gerardo Territo, Para March, NP      PDMP not reviewed this encounter.   Clancy Gourd, NP 04/14/23 2043

## 2023-04-14 NOTE — ED Triage Notes (Signed)
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 Denies recent blunt injury but admits to old fracuture to right elbow several months ago.

## 2023-04-14 NOTE — Discharge Instructions (Addendum)
Your xray is inconclusive, may have an occult radial head fracture.  We are splinting your elbow until you can be seen by orthopedics. Rest,ice, take meloxicam as prescribed. Follow up with Orthopedics for MRI/CT,further management.

## 2023-07-15 ENCOUNTER — Encounter: Payer: Self-pay | Admitting: Emergency Medicine

## 2023-07-15 ENCOUNTER — Ambulatory Visit: Payer: BC Managed Care – PPO

## 2023-07-15 ENCOUNTER — Ambulatory Visit
Admission: EM | Admit: 2023-07-15 | Discharge: 2023-07-15 | Disposition: A | Discharge status: Home / Self Care | Payer: BC Managed Care – PPO | Attending: Emergency Medicine | Admitting: Emergency Medicine

## 2023-07-15 DIAGNOSIS — R9431 Abnormal electrocardiogram [ECG] [EKG]: Secondary | ICD-10-CM | POA: Diagnosis not present

## 2023-07-15 DIAGNOSIS — R7401 Elevation of levels of liver transaminase levels: Secondary | ICD-10-CM | POA: Insufficient documentation

## 2023-07-15 DIAGNOSIS — R202 Paresthesia of skin: Secondary | ICD-10-CM | POA: Insufficient documentation

## 2023-07-15 DIAGNOSIS — R55 Syncope and collapse: Secondary | ICD-10-CM | POA: Insufficient documentation

## 2023-07-15 LAB — COMPREHENSIVE METABOLIC PANEL
ALT: 56 U/L — ABNORMAL HIGH (ref 0–44)
AST: 141 U/L — ABNORMAL HIGH (ref 15–41)
Albumin: 4.6 g/dL (ref 3.5–5.0)
Alkaline Phosphatase: 83 U/L (ref 38–126)
Anion gap: 13 (ref 5–15)
BUN: 5 mg/dL — ABNORMAL LOW (ref 6–20)
CO2: 23 mmol/L (ref 22–32)
Calcium: 8.5 mg/dL — ABNORMAL LOW (ref 8.9–10.3)
Chloride: 106 mmol/L (ref 98–111)
Creatinine, Ser: 0.48 mg/dL (ref 0.44–1.00)
GFR, Estimated: >60 mL/min (ref >60)
Glucose, Bld: 96 mg/dL (ref 70–99)
Potassium: 4 mmol/L (ref 3.5–5.1)
Sodium: 142 mmol/L (ref 135–145)
Total Bilirubin: 1 mg/dL (ref <1.2)
Total Protein: 8 g/dL (ref 6.5–8.1)

## 2023-07-15 LAB — CBC WITH DIFFERENTIAL/PLATELET
Abs Immature Granulocytes: 0.03 10*3/uL (ref 0.00–0.07)
Basophils Absolute: 0.1 10*3/uL (ref 0.0–0.1)
Basophils Relative: 1 %
Eosinophils Absolute: 0.3 10*3/uL (ref 0.0–0.5)
Eosinophils Relative: 4 %
HCT: 42.9 % (ref 36.0–46.0)
Hemoglobin: 15.3 g/dL — ABNORMAL HIGH (ref 12.0–15.0)
Immature Granulocytes: 1 %
Lymphocytes Relative: 35 %
Lymphs Abs: 2.2 10*3/uL (ref 0.7–4.0)
MCH: 36.2 pg — ABNORMAL HIGH (ref 26.0–34.0)
MCHC: 35.7 g/dL (ref 30.0–36.0)
MCV: 101.4 fL — ABNORMAL HIGH (ref 80.0–100.0)
Monocytes Absolute: 1 10*3/uL (ref 0.1–1.0)
Monocytes Relative: 16 %
Neutro Abs: 2.8 10*3/uL (ref 1.7–7.7)
Neutrophils Relative %: 43 %
Platelets: 228 10*3/uL (ref 150–400)
RBC: 4.23 MIL/uL (ref 3.87–5.11)
RDW: 12 % (ref 11.5–15.5)
WBC: 6.4 10*3/uL (ref 4.0–10.5)
nRBC: 0 % (ref 0.0–0.2)

## 2023-07-15 NOTE — ED Provider Notes (Signed)
[facility]    CSN: [id number] Arrival date & time: 07/15/23  1324      History   Chief Complaint Chief Complaint  Patient presents with   Tremors   hand swelling    Weakness    HPI Jessica Cooley is a 37 y.o. female.   HPI  37 year old female with a past medical history significant for hepatitis C with treatment, headaches, and anxiety presents for evaluation of swelling in both of her hands with tingling and pins-and-needles in her hands and arms with associated tremors.  She reports that this has been going on for the last 2 days and she has noticed that she has had difficulty with her ADLs to include putting on make-up, putting in contacts, and when she is writing on the board.  The patient works as a Runner, broadcasting/film/video.  She is also noticed some dizziness, nausea, and vomiting but these have been ongoing.  She is also noticed that she has had some flashes of light and spots in her eyes.  This is associated with a feeling of lightheadedness.  Today she has noticed that she has had some heaviness in her legs and some cramping.  She also reports that at night, since having [facility] spotted fever in June, she has been experiencing night sweats where she frequently has to get up and change her sheets in the middle the night.  This is also been associated with a precipitous weight loss.  Patient is unable to quantify the exact amount of weight though states that she has lost 2 gene sizes since initially being diagnosed.  She denies any headache or rash.  She does have a history of anemia.  Past Medical History:  Diagnosis Date   Anxiety    Headache    occular migraines   Hepatitis    C- treatment completed    Patient Active Problem List   Diagnosis Date Noted   Right elbow pain 04/14/2023   Tendonitis of elbow, right 04/14/2023   Occult closed fracture of right elbow 04/14/2023    Past Surgical History:  Procedure Laterality Date   KNEE ARTHROSCOPY WITH ANTERIOR  CRUCIATE LIGAMENT (ACL) REPAIR WITH HAMSTRING GRAFT Right 11/25/2021   Procedure: Right arthroscopic ACL reconstruction using quadriceps tendon autograft;  Surgeon: Signa Kell, MD;  Location: [facility];  Service: Orthopedics;  Laterality: Right;   KNEE ARTHROSCOPY WITH MACI CARTILAGE HARVEST Right 11/25/2021   Procedure: KNEE ARTHROSCOPY WITH MACI CARTILAGE HARVEST;  Surgeon: Signa Kell, MD;  Location: [facility];  Service: Orthopedics;  Laterality: Right;   LIVER BIOPSY      OB History   No obstetric history on file.      Home Medications    Prior to Admission medications   Medication Sig Start Date End Date Taking? Authorizing Provider  clonazePAM (KLONOPIN) 1 MG tablet TAKE 1 TABLET BY MOUTH TWICE A DAY AS NEEDED SEVERE ANXIETY 08/01/22  Yes [provider]  EPINEPHrine 0.3 mg/0.3 mL IJ SOAJ injection Inject 0.3 mg into the muscle as needed for anaphylaxis.   Yes [provider]  clobetasol ointment (TEMOVATE) 0.05 % Apply 1 Application topically 2 (two) times daily. 09/01/22   McDonald, Rachelle Hora, DPM  doxycycline (VIBRAMYCIN) 100 MG capsule Take 1 capsule (100 mg total) by mouth 2 (two) times daily. 08/22/22   Becky Augusta, NP  ondansetron (ZOFRAN-ODT) 4 MG disintegrating tablet Take 1 tablet (4 mg total) by mouth every 8 (eight) hours as needed for nausea or vomiting.  11/25/21   Signa Kell, MD  predniSONE (STERAPRED UNI-PAK 21 TAB) 10 MG (21) TBPK tablet Take 6 tablets on day 1, 5 tablets day 2, 4 tablets day 3, 3 tablets day 4, 2 tablets day 5, 1 tablet day 6 08/22/22   Becky Augusta, NP  promethazine (PHENERGAN) 25 MG tablet TAKE 1 TABLET BY MOUTH TWICE A DAY AS NEEDED NAUSEA/VOMITING 08/01/22   [provider]  propranolol (INDERAL) 10 MG tablet TAKE 1 TABLET BY MOUTH THREE TIMES A DAY AS NEEDED AS NEEDED FOR ANXIETY 08/01/22   [provider]  traMADol Janean Sark) 50 MG tablet Take by mouth. 07/15/22   [provider]    Family History History  reviewed. No pertinent family history.  Social History Social History   Tobacco Use   Smoking status: Some Days    Types: Cigarettes    Passive exposure: Never   Smokeless tobacco: Never  Vaping Use   Vaping status: Never Used  Substance Use Topics   Alcohol use: Yes    Comment: social   Drug use: Never     Allergies   Bee venom and Mango flavor   Review of Systems Review of Systems  Constitutional:  Positive for diaphoresis.       Night sweats.  Respiratory:  Positive for shortness of breath. Negative for cough and wheezing.   Cardiovascular:  Positive for palpitations.  Musculoskeletal:  Negative for arthralgias and joint swelling.  Skin:  Negative for rash.  Neurological:  Positive for syncope, weakness, light-headedness and numbness. Negative for headaches.     Physical Exam Triage Vital Signs ED Triage Vitals  Encounter Vitals Group     BP      Systolic BP Percentile      Diastolic BP Percentile      Pulse      Resp      Temp      Temp src      SpO2      Weight      Height      Head Circumference      Peak Flow      Pain Score      Pain Loc      Pain Education      Exclude from Growth Chart    No data found.  Updated Vital Signs BP (!) 116/90 (BP Location: Left Arm)   Pulse 89   Temp 98.8 F (37.1 C)   Resp 18   LMP  (LMP Unknown)   SpO2 97%   Visual Acuity Right Eye Distance:   Left Eye Distance:   Bilateral Distance:    Right Eye Near:   Left Eye Near:    Bilateral Near:     Physical Exam Vitals and nursing note reviewed.  Constitutional:      Appearance: Normal appearance. She is not ill-appearing.  HENT:     Head: Normocephalic and atraumatic.     Mouth/Throat:     Mouth: Mucous membranes are moist.     Pharynx: Oropharynx is clear. No oropharyngeal exudate or posterior oropharyngeal erythema.  Eyes:     Extraocular Movements: Extraocular movements intact.     Conjunctiva/sclera: Conjunctivae normal.     Pupils: Pupils  are equal, round, and reactive to light.  Cardiovascular:     Rate and Rhythm: Normal rate and regular rhythm.     Pulses: Normal pulses.     Heart sounds: Normal heart sounds. No murmur heard.    No friction rub.  No gallop.  Pulmonary:     Effort: Pulmonary effort is normal.     Breath sounds: Normal breath sounds. No wheezing, rhonchi or rales.  Musculoskeletal:     Cervical back: Normal range of motion and neck supple.  Lymphadenopathy:     Cervical: No cervical adenopathy.  Skin:    General: Skin is warm and dry.     Capillary Refill: Capillary refill takes less than 2 seconds.     Findings: No rash.  Neurological:     General: No focal deficit present.     Mental Status: She is alert and oriented to person, place, and time.      UC Treatments / Results  Labs (all labs ordered are listed, but only abnormal results are displayed) Labs Reviewed  CBC WITH DIFFERENTIAL/PLATELET - Abnormal; Notable for the following components:      Result Value   Hemoglobin 15.3 (*)    MCV 101.4 (*)    MCH 36.2 (*)    All other components within normal limits  COMPREHENSIVE METABOLIC PANEL - Abnormal; Notable for the following components:   BUN 5 (*)    Calcium 8.5 (*)    AST 141 (*)    ALT 56 (*)    All other components within normal limits  LYME DISEASE SEROLOGY W/REFLEX    EKG Normal sinus rhythm with sinus arrhythmia Ventricular rate 94 bpm Peer interval 164 ms QRS duration 80 ms QT/QTc 372/465 ms No ST or T wave abnormalities noted.  Radiology No results found.  Procedures Procedures (including critical care time)  Medications Ordered in UC Medications - No data to display  Initial Impression / Assessment and Plan / UC Course  I have reviewed the triage vital signs and the nursing notes.  Pertinent labs & imaging results that were available during my care of the patient were reviewed by me and considered in my medical decision making (see chart for details).    Patient is a nontoxic-appearing 37 year old female presenting for evaluation of paresthesias in her upper extremities with associated hand swelling and feeling of heaviness in all of her extremities that started 2 days ago.  She was treated for [facility] spotted fever this past summer and had a precipitous weight loss during that time.  She notices that she has continued to lose weight if she has lost 2 pant sizes in the past 4 months.  She also endorses that at night she will have an irregular heartbeat, a feeling of shortness of breath, and also has night sweats which will frequently result in her having to change her sheets.  She is an avid runner and states that she is able to run her 5 miles without difficulty however.  On exam the patient's hands do not appear swollen and there is no rash on her hands, forearms, or lower extremities.  No oral petechiae noted and no cervical lymphadenopathy present.  Heart sounds are S1-S2 with regular rate and rhythm and lung sounds are clear to auscultation all fields.  Given that she has a history of [facility] spotted fever as well as anemia I will order a CBC to evaluate for any signs of anemia or blood dyscrasias.  Additionally, she has a history of enlarged liver as result the recommended spotted fever along with hepatic steatosis so I will order a CMP to evaluate for any electrolyte abnormalities or alterations in liver function.  Given her night sweats, palpitations, and weight loss I am concerned about possible B  symptoms so I will order a chest x-ray to evaluate for any cardiopulmonary pathology.  She also indicates that she had a syncopal event yesterday that lasted several minutes so I will order an EKG to evaluate for any cardiac abnormalities.  Given patient's history of hepatitis C with an unknown origin possible workup to include HIV and RPR could be worthwhile.  No evidence of previous testing available in epic.  EKG shows normal sinus rhythm with sinus  arrhythmia as well as prolonged QT as her QTc is greater than 460 ms.  This prolonged QT is a new finding that was not present on EKG performed that [facility] on 03/16/2019.  Chest x-ray independently reviewed and evaluated by me.  Impression: No evidence of infiltrate or effusion.  Cardiomediastinal silhouette appears normal.  Radiology report is pending.  CMP shows normal electrolytes and renal function.  AST and ALT are 141 and 56 respectively.  This is an interval increase from March 02, 2023 when her AST was 54 and ALT was 28.  CBC shows normal white count and red count.  Hemoglobin is mildly elevated at 15.3 with a normal hematocrit of 42.9.  MCV is 101.4 and MCH is 36.2.  Platelets are normal at 228.  No abnormalities to the differential.  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  She also is post treatment for hepatitis C.  I have contacted Dr. Jasmine December who is on-call for infectious disease to discuss the case.  After talking with Dr. Gershon Mussel he does not feel that this is a sequela of RMSF.  He did suggest sending a due to your Lyme panel as well as making referral to neurology and cardiology.  I discussed these findings with the patient and she is in agreement.  I also discussed her hepatitis C treatment, which was performed in the [place], and she reports that she was tested for HIV and RPR at that time which were both negative.   Final Clinical Impressions(s) / UC Diagnoses   Final diagnoses:  Syncope, unspecified syncope type  Paresthesias  QT prolongation  Transaminitis     Discharge Instructions      As we discussed, your EKG shows that you have something called QT prolongation or long QT syndrome.  This may be what accounted for the episode of passing out and lightheadedness.  I have referred you to cardiology for further evaluation.  The pins and needle sensation in your arm, heaviness in your legs, and difficulty with fine motor  coordination is of unclear origin.  Your electrolytes were completely normal.  I have referred you to neurology for further evaluation based on those findings.  I would also make an appointment to follow-up with your primary care provider to discuss your ongoing symptoms.  Sometimes pins-and-needles and heaviness can be a result of nutritional deficits or vitamin deficiencies.  We are unable to test for this at the urgent care but your primary care provider could order a panel.  We are going to order a Lyme serology panel to evaluate for the presence of any additional tickborne illness which may be explaining some of your symptoms.  Those labs may take a couple of days to come back.  If you have any more episodes of fainting I recommend you call 911 and go to the ER for reevaluation.  Also if you develop any visual disturbances such as blurry vision or double vision, or if you develop any  severe headache.     ED Prescriptions   None    PDMP not reviewed this encounter.   Becky Augusta, NP 07/15/23 1606

## 2023-07-15 NOTE — Discharge Instructions (Addendum)
As we discussed, your EKG shows that you have something called QT prolongation or long QT syndrome.  This may be what accounted for the episode of passing out and lightheadedness.  I have referred you to cardiology for further evaluation.  The pins and needle sensation in your arm, heaviness in your legs, and difficulty with fine motor coordination is of unclear origin.  Your electrolytes were completely normal.  I have referred you to neurology for further evaluation based on those findings.  I would also make an appointment to follow-up with your primary care provider to discuss your ongoing symptoms.  Sometimes pins-and-needles and heaviness can be a result of nutritional deficits or vitamin deficiencies.  We are unable to test for this at the urgent care but your primary care provider could order a panel.  We are going to order a Lyme serology panel to evaluate for the presence of any additional tickborne illness which may be explaining some of your symptoms.  Those labs may take a couple of days to come back.  If you have any more episodes of fainting I recommend you call 911 and go to the ER for reevaluation.  Also if you develop any visual disturbances such as blurry vision or double vision, or if you develop any severe headache.

## 2023-07-15 NOTE — ED Triage Notes (Addendum)
Pt presents with tremors, bilateral hand swelling and tingling. Her legs feels heavy and weak. She has some dizziness, nausea and vomiting that has been ongoing. She reports a deficiency in vitamin B12 and anemia.   Pt passed out at home yesterday. She was alone and did not fall on the floor. She went to couch when she started to have symptoms.

## 2023-07-16 LAB — LYME DISEASE SEROLOGY W/REFLEX: Lyme Total Antibody EIA: NEGATIVE

## 2023-07-26 NOTE — Progress Notes (Unsigned)
  Cardiology Office Note:   Date:  07/26/2023  ID:  Jessica Cooley, DOB [birth year], MRN [id number] PCP:  McCain, [place] Steven, DO  [facility] Providers Cardiologist:  Alverda Skeans, MD Referring MD: McCain, [place] Steven, DO  Chief Complaint/Reason for Referral: Syncope ASSESSMENT:    PATIENT DID NOT APPEAR FOR APPOINTMENT   1. Palpitations   2. Postural dizziness with presyncope     PLAN:   In order of problems listed above: Palpitations: Will check reflex TSH, echocardiogram, and monitor.   Presyncope: See discussion above.  PATIENT DID NOT APPEAR FOR APPOINTMENT     History of Present Illness:      FOCUSED PROBLEM LIST:   Hepatitis C Treated with antiretrovirals Anxiety [facility] spotted fever June 2024  December 2024: The patient is a 37 year old female with the above listed medical problems referred to cardiology due to palpitations and syncope.  The patient was seen in the emergency department in November.  She had noticed paresthesias of her upper extremities, tremors, and difficulty with motor movement.  She is also noticed dizziness, nausea, and vomiting.  She is experienced a significant weight loss as well.  The patient CBC, CMP, and Lyme serologies were negative.  Her chest x-ray demonstrated no acute abnormalities.  An EKG demonstrated sinus rhythm with sinus arrhythmia.  QTc was reported at 465 ms.  She was referred for allergy opinion.          Current Medications: No outpatient medications have been marked as taking for the 07/27/23 encounter (Appointment) with Orbie Pyo, MD.     Review of Systems:   Please see the history of present illness.    All other systems reviewed and are negative.     EKGs/Labs/Other Test Reviewed:   EKG: EKG that I reviewed from November 2024 demonstrates sinus rhythm with sinus arrhythmia and a QTc of around 465 ms.  EKG Interpretation Date/Time:    Ventricular Rate:    PR Interval:    QRS Duration:     QT Interval:    QTC Calculation:   R Axis:      Text Interpretation:             Signed, Orbie Pyo, MD  07/26/2023 7:56 AM    [facility] [address] [place], [place], [place]  [address] Phone: [phone]; Fax: [phone]   Note:  This document was prepared using Dragon voice recognition software and may include unintentional dictation errors.

## 2023-07-27 ENCOUNTER — Ambulatory Visit: Payer: BC Managed Care – PPO | Attending: Cardiology | Admitting: Cardiology

## 2023-07-27 ENCOUNTER — Encounter: Payer: Self-pay | Admitting: Cardiology

## 2023-07-27 ENCOUNTER — Ambulatory Visit (INDEPENDENT_AMBULATORY_CARE_PROVIDER_SITE_OTHER): Payer: BC Managed Care – PPO | Admitting: Internal Medicine

## 2023-07-27 VITALS — BP 116/81 | HR 78 | Ht 64.0 in | Wt 124.0 lb

## 2023-07-27 DIAGNOSIS — R002 Palpitations: Secondary | ICD-10-CM

## 2023-07-27 DIAGNOSIS — R55 Syncope and collapse: Secondary | ICD-10-CM | POA: Diagnosis not present

## 2023-07-27 NOTE — Patient Instructions (Signed)
 Medication Instructions:  The current medical regimen is effective;  continue present plan and medications.  *If you need a refill on your cardiac medications before your next appointment, please call your pharmacy*  Testing/Procedures: Your physician has requested that you have an echocardiogram. Echocardiography is a painless test that uses sound waves to create images of your heart. It provides your doctor with information about the size and shape of your heart and how well your heart's chambers and valves are working. This procedure takes approximately one hour. There are no restrictions for this procedure. Please do NOT wear cologne, perfume, aftershave, or lotions (deodorant is allowed). Please arrive 15 minutes prior to your appointment time.  Please note: We ask at that you not bring children with you during ultrasound (echo/ vascular) testing. Due to room size and safety concerns, children are not allowed in the ultrasound rooms during exams. Our front office staff cannot provide observation of children in our lobby area while testing is being conducted. An adult accompanying a patient to their appointment will only be allowed in the ultrasound room at the discretion of the ultrasound technician under special circumstances. We apologize for any inconvenience.  ZIO XT- Long Term Monitor Instructions  Your physician has requested you wear a ZIO patch monitor for 14 days.  This is a single patch monitor. [employer] supplies one patch monitor per enrollment. Additional stickers are not available. Please do not apply patch if you will be having a Nuclear Stress Test,  Echocardiogram, Cardiac CT, MRI, or Chest Xray during the period you would be wearing the  monitor. The patch cannot be worn during these tests. You cannot remove and re-apply the  ZIO XT patch monitor.  Your ZIO patch monitor will be mailed 3 day USPS to your address on file. It may take 3-5 days  to receive your monitor after  you have been enrolled.  Once you have received your monitor, please review the enclosed instructions. Your monitor  has already been registered assigning a specific monitor serial # to you.  Billing and Patient Assistance Program Information  We have supplied [employer] with any of your insurance information on file for billing purposes. [employer] offers a sliding scale Patient Assistance Program for patients that do not have  insurance, or whose insurance does not completely cover the cost of the ZIO monitor.  You must apply for the Patient Assistance Program to qualify for this discounted rate.  To apply, please call [employer] at [phone], select option 4, select option 2, ask to apply for  Patient Assistance Program. Meredeth Ide will ask your household income, and how many people  are in your household. They will quote your out-of-pocket cost based on that information.  [employer] will also be able to set up a 70-month, interest-free payment plan if needed.  Applying the monitor   Shave hair from upper left chest.  Hold abrader disc by orange tab. Rub abrader in 40 strokes over the upper left chest as  indicated in your monitor instructions.  Clean area with 4 enclosed alcohol pads. Let dry.  Apply patch as indicated in monitor instructions. Patch will be placed under collarbone on left  side of chest with arrow pointing upward.  Rub patch adhesive wings for 2 minutes. Remove white label marked "1". Remove the white  label marked "2". Rub patch adhesive wings for 2 additional minutes.  While looking in a mirror, press and release button in center of patch. A small green light will  flash  3-4 times. This will be your only indicator that the monitor has been turned on.  Do not shower for the first 24 hours. You may shower after the first 24 hours.  Press the button if you feel a symptom. You will hear a small click. Record Date, Time and  Symptom in the Patient Logbook.  When you are ready to  remove the patch, follow instructions on the last 2 pages of Patient  Logbook. Stick patch monitor onto the last page of Patient Logbook.  Place Patient Logbook in the blue and white box. Use locking tab on box and tape box closed  securely. The blue and white box has prepaid postage on it. Please place it in the mailbox as  soon as possible. Your physician should have your test results approximately 7 days after the  monitor has been mailed back to [facility]  Call [facility] Customer Care at [phone] if you have questions regarding  your ZIO XT patch monitor. Call them immediately if you see an orange light blinking on your  monitor.  If your monitor falls off in less than 4 days, contact our Monitor department at [phone].  If your monitor becomes loose or falls off after 4 days call [employer] at [phone] for  suggestions on securing your monitor   Follow-Up: At [facility], you and your health needs are our priority.  As part of our continuing mission to provide you with exceptional heart care, we have created designated Provider Care Teams.  These Care Teams include your primary Cardiologist (physician) and Advanced Practice Providers (APPs -  Physician Assistants and Nurse Practitioners) who all work together to provide you with the care you need, when you need it.  We recommend signing up for the patient portal called "MyChart".  Sign up information is provided on this After Visit Summary.  MyChart is used to connect with patients for Virtual Visits (Telemedicine).  Patients are able to view lab/test results, encounter notes, upcoming appointments, etc.  Non-urgent messages can be sent to your provider as well.   To learn more about what you can do with MyChart, go to [email or web address].    Your next appointment:   Follow up will be based on the results of the above testing.

## 2023-07-27 NOTE — Progress Notes (Signed)
Cardiology Office Note:  .   Date:  07/27/2023  ID:  Jessica Cooley, DOB 1985/09/30, MRN [id number] PCP: McCain, [place] Steven, DO  [facility] Providers Cardiologist:  None     History of Present Illness: [name]   Jessica Cooley is a 37 y.o. female Discussed the use of AI scribe software for clinical note transcription with the patient, who gave verbal consent to proceed.  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 The patient reports a history of [facility] spotted fever in June 2024, past treatment for Hepatitis C, and recent weight loss. She also mentions experiencing night sweats. An EKG performed previously showed normal sinus rhythm with sinus arrhythmia and a QTc of 460. The patient's HIV and RPR tests were negative.  The patient describes an episode of syncope that occurred after waking from a dream and going to the refrigerator. She also reports instances of waking up drenched in sweat, feeling cold, and experiencing discomfort in the chest. The patient has a history of fainting, particularly in the early morning, which has resulted in a concussion and stitches. Despite these episodes, the patient maintains an active lifestyle as a distance runner.  The patient also mentions a history of ocular migraines and chest pain when pushing herself during running. She has not experienced any sudden fainting during these running episodes. The patient has a family history of sudden infant death syndrome (SIDS) in a sibling and sudden death due to stroke or aneurysm in a paternal grandmother.  The patient admits to heavy drinking on weekends and has been warned about potential liver problems due to past Hepatitis C. She has been treated for Hepatitis C in the [place] and cleared. The patient also reports skin problems, primarily on the face, and has been treated by a dermatologist. She was treated for [facility] spotted fever in June and experienced  anaphylactic shock after a wasp sting, for which she has been prescribed EpiPens.           Studies Reviewed: [name]   EKG Interpretation Date/Time:  Monday July 27 2023 10:14:21 EST Ventricular Rate:  78 PR Interval:  156 QRS Duration:  80 QT Interval:  408 QTC Calculation: 465 R Axis:   74  Text Interpretation: Normal sinus rhythm Normal ECG When compared with ECG of 15-Jul-2023 14:33, No significant change was found Confirmed by Donato Schultz ([id number]) on 07/27/2023 10:19:33 AM    Results Procedure: Electrocardiogram (EKG) Description: EKG performed today shows QTC of 465 milliseconds with QT interval of 408 milliseconds. The QTC is borderline. The QT interval is normal.  DIAGNOSTIC EKG: normal sinus rhythm with sinus arrhythmia, QTc of 460 ms  Risk Assessment/Calculations:            Physical Exam:   VS:  BP 116/81   Pulse 78   Ht 5\' 4"  (1.626 m)   Wt 124 lb (56.2 kg)   LMP  (LMP Unknown)   SpO2 93%   BMI 21.28 kg/m    Wt Readings from Last 3 Encounters:  07/27/23 124 lb (56.2 kg)  08/22/22 140 lb (63.5 kg)  11/25/21 140 lb (63.5 kg)    GEN: Well nourished, well developed in no acute distress NECK: No JVD; No carotid bruits CARDIAC: RRR, no murmurs, no rubs, no gallops RESPIRATORY:  Clear to auscultation without rales, wheezing or rhonchi  ABDOMEN: Soft, non-tender, non-distended EXTREMITIES:  No edema; No deformity   ASSESSMENT AND PLAN: .  Assessment and Plan    Syncope Experienced a syncopal episode likely due to orthostatic hypotension. EKG showed a borderline prolonged QTc of 460 ms. History of night sweats and weight loss may be contributing factors. Discussed cautious use of propranolol due to its hypotensive effects. Advised on hydration and electrolyte intake. - Order Zio patch for continuous heart monitoring for two weeks - Order echocardiogram to evaluate heart structure and function - Perform repeat EKG to recheck QT interval - Advise on  cautious use of propranolol, ensuring adequate hydration and electrolyte intake  Borderline Prolonged QT Interval EKG showed a QTc of 465 ms, borderline prolonged. Potential for arrhythmias, significance uncertain. Further monitoring required. - Continue with Zio patch monitoring - Continue with echocardiogram  Hepatitis C (Resolved) Treated and cleared hepatitis C. Advised to avoid excessive alcohol consumption due to potential liver damage. - Advise to limit alcohol intake - Encourage hydration and electrolyte intake  General Health Maintenance Advised on hydration and salt intake to manage low blood pressure. - Advise to hydrate well and consume electrolytes - Encourage increased salt intake to help maintain blood pressure  Follow-up - Will follow-up with results of test.               Signed, Donato Schultz, MD

## 2023-07-28 ENCOUNTER — Ambulatory Visit: Payer: BC Managed Care – PPO | Attending: Cardiology

## 2023-07-28 DIAGNOSIS — R55 Syncope and collapse: Secondary | ICD-10-CM

## 2023-07-28 NOTE — Progress Notes (Unsigned)
Enrolled patient for a 14 day Zio XT  monitor to be mailed to patients home  °

## 2023-08-16 DIAGNOSIS — R55 Syncope and collapse: Secondary | ICD-10-CM

## 2023-08-28 ENCOUNTER — Ambulatory Visit (HOSPITAL_COMMUNITY): Payer: BC Managed Care – PPO | Attending: Cardiology

## 2023-08-28 DIAGNOSIS — R55 Syncope and collapse: Secondary | ICD-10-CM | POA: Insufficient documentation

## 2023-08-29 LAB — ECHOCARDIOGRAM COMPLETE
Area-P 1/2: 5.75 cm2
S' Lateral: 2.8 cm

## 2024-08-20 ENCOUNTER — Ambulatory Visit
Admission: RE | Admit: 2024-08-20 | Discharge: 2024-08-20 | Disposition: A | Discharge status: Home / Self Care | Source: Ambulatory Visit | Attending: Emergency Medicine | Admitting: Emergency Medicine

## 2024-08-20 VITALS — BP 97/70 | HR 93 | Temp 98.5°F | Resp 16 | Ht 64.0 in | Wt 123.9 lb

## 2024-08-20 DIAGNOSIS — Z3202 Encounter for pregnancy test, result negative: Secondary | ICD-10-CM

## 2024-08-20 DIAGNOSIS — R17 Unspecified jaundice: Secondary | ICD-10-CM | POA: Diagnosis not present

## 2024-08-20 DIAGNOSIS — R8290 Unspecified abnormal findings in urine: Secondary | ICD-10-CM

## 2024-08-20 DIAGNOSIS — R160 Hepatomegaly, not elsewhere classified: Secondary | ICD-10-CM

## 2024-08-20 DIAGNOSIS — N912 Amenorrhea, unspecified: Secondary | ICD-10-CM

## 2024-08-20 DIAGNOSIS — R197 Diarrhea, unspecified: Secondary | ICD-10-CM | POA: Diagnosis not present

## 2024-08-20 DIAGNOSIS — R112 Nausea with vomiting, unspecified: Secondary | ICD-10-CM | POA: Diagnosis not present

## 2024-08-20 LAB — POCT URINE PREGNANCY: Preg Test, Ur: NEGATIVE

## 2024-08-20 LAB — POCT URINE DIPSTICK
Blood, UA: NEGATIVE
Glucose, UA: 100 mg/dL — AB
Nitrite, UA: POSITIVE — AB
POC PROTEIN,UA: >=300 — AB
Spec Grav, UA: 1.01
Urobilinogen, UA: >=8 U/dL — AB
pH, UA: 6.5

## 2024-08-20 MED ORDER — ONDANSETRON 4 MG PO TBDP
4.0000 mg | ORAL_TABLET | Freq: Once | ORAL | Status: AC
  Administered 2024-08-20: 4 mg via ORAL

## 2024-08-20 NOTE — Discharge Instructions (Addendum)
 Please go to the emergency department at [facility] to have your liver enzymes checked due to the fact that you have yellowing of your eyes and your liver is palpable below your rib cage which should not be.  This is especially concerning given your previous history of hepatitis C.

## 2024-08-20 NOTE — ED Notes (Signed)
 Patient is being discharged from the Urgent Care and sent to the Emergency Department via POV . Per Venetia Motto. NP, patient is in need of higher level of care due to Hepatomegaly, scleral icterus, nausea, vomiting. Patient is aware and verbalizes understanding of plan of care.  Vitals:   08/20/24 1010  BP: 97/70  Pulse: 93  Resp: 16  Temp: 98.5 F (36.9 C)  SpO2: 100%

## 2024-08-20 NOTE — ED Provider Notes (Signed)
 " [facility]    CSN: [id number] Arrival date & time: 08/20/24  1000      History   Chief Complaint Chief Complaint  Patient presents with   Emesis   Appointment    HPI Jessica Cooley is a 38 y.o. female.   HPI  38 year old female with past medical history significant for prolonged QT, RMSF, syncope and collapse, hepatitis C status post antiviral treatment presents for evaluation of nausea, vomiting, loss of appetite x 4 days.  No fever or abdominal pain.  She reports 2 episodes of vomiting in the morning and 1-2 episodes in the afternoon with 4-5 episodes of diarrhea during the day.  No blood in either 1.  She reports that since contracting [facility] spotted fever little over a year ago she has been having these cyclical episodes of nausea and vomiting that are currently 1.5 to 2 months apart.  She noticed that her eyes started to turn yellow on August 16, 2024.  She denies any rashes or itching.  Past Medical History:  Diagnosis Date   Anxiety    Clotting disorder    Headache    occular migraines   Hepatitis    C- treatment completed   [facility] spotted fever    Syncope and collapse     Patient Active Problem List   Diagnosis Date Noted   Right elbow pain 04/14/2023   Tendonitis of elbow, right 04/14/2023   Occult closed fracture of right elbow 04/14/2023    Past Surgical History:  Procedure Laterality Date   KNEE ARTHROSCOPY WITH ANTERIOR CRUCIATE LIGAMENT (ACL) REPAIR WITH HAMSTRING GRAFT Right 11/25/2021   Procedure: Right arthroscopic ACL reconstruction using quadriceps tendon autograft;  Surgeon: Tobie Priest, MD;  Location: [facility] ORS;  Service: Orthopedics;  Laterality: Right;   KNEE ARTHROSCOPY WITH MACI  CARTILAGE HARVEST Right 11/25/2021   Procedure: KNEE ARTHROSCOPY WITH MACI  CARTILAGE HARVEST;  Surgeon: Tobie Priest, MD;  Location: [facility] ORS;  Service: Orthopedics;  Laterality: Right;   LIVER BIOPSY      OB History   No obstetric  history on file.      Home Medications    Prior to Admission medications  Medication Sig Start Date End Date Taking? Authorizing Provider  EPINEPHrine  0.3 mg/0.3 mL IJ SOAJ injection Inject 0.3 mg into the muscle as needed for anaphylaxis.    [provider]  ibuprofen  (ADVIL ) 200 MG tablet Take by mouth.    [provider]  propranolol (INDERAL) 10 MG tablet TAKE 1 TABLET BY MOUTH THREE TIMES A DAY AS NEEDED AS NEEDED FOR ANXIETY 08/01/22   [provider]    Family History Family History  Problem Relation Age of Onset   Alcoholism Father    SIDS Brother     Social History Social History[1]   Allergies   Bee venom and Mango flavoring agent (non-screening)   Review of Systems Review of Systems  Constitutional:  Negative for fever.  Gastrointestinal:  Positive for diarrhea, nausea and vomiting. Negative for abdominal pain and blood in stool.  Skin:  Positive for rash.     Physical Exam Triage Vital Signs ED Triage Vitals  Encounter Vitals Group     BP      Girls Systolic BP Percentile      Girls Diastolic BP Percentile      Boys Systolic BP Percentile      Boys Diastolic BP Percentile      Pulse      Resp  Temp      Temp src      SpO2      Weight      Height      Head Circumference      Peak Flow      Pain Score      Pain Loc      Pain Education      Exclude from Growth Chart    No data found.  Updated Vital Signs BP 97/70 (BP Location: Left Arm)   Pulse 93   Temp 98.5 F (36.9 C) (Oral)   Resp 16   Ht 5' 4 (1.626 m)   Wt 123 lb 14.4 oz (56.2 kg)   LMP 06/16/2024 (Approximate) Comment: pt states she has been having irregular periods.  SpO2 100%   BMI 21.27 kg/m   Visual Acuity Right Eye Distance:   Left Eye Distance:   Bilateral Distance:    Right Eye Near:   Left Eye Near:    Bilateral Near:     Physical Exam Vitals and nursing note reviewed.  Constitutional:      Appearance: Normal appearance. She  is not ill-appearing.  HENT:     Head: Normocephalic and atraumatic.     Mouth/Throat:     Mouth: Mucous membranes are moist.     Pharynx: Oropharynx is clear. No oropharyngeal exudate or posterior oropharyngeal erythema.     Comments: Yellowing of oral mucosa Eyes:     General: Scleral icterus present.     Extraocular Movements: Extraocular movements intact.     Pupils: Pupils are equal, round, and reactive to light.  Cardiovascular:     Rate and Rhythm: Normal rate and regular rhythm.     Pulses: Normal pulses.     Heart sounds: Normal heart sounds. No murmur heard.    No friction rub. No gallop.  Pulmonary:     Effort: Pulmonary effort is normal.     Breath sounds: Normal breath sounds. No wheezing, rhonchi or rales.  Abdominal:     General: Abdomen is flat.     Palpations: Abdomen is soft.     Tenderness: There is no abdominal tenderness. There is no guarding or rebound.     Comments: Liver is palpable approximately 3 cm below the costal margin.  Skin:    General: Skin is warm and dry.     Capillary Refill: Capillary refill takes less than 2 seconds.     Coloration: Skin is jaundiced.     Findings: No rash.  Neurological:     General: No focal deficit present.     Mental Status: She is alert and oriented to person, place, and time.      UC Treatments / Results  Labs (all labs ordered are listed, but only abnormal results are displayed) Labs Reviewed  POCT URINE DIPSTICK - Abnormal; Notable for the following components:      Result Value   Color, UA brown (*)    Glucose, UA =100 (*)    Bilirubin, UA large (*)    Ketones, POC UA moderate (40) (*)    POC PROTEIN,UA >=300 (*)    Urobilinogen, UA >=8.0 (*)    Nitrite, UA Positive (*)    Leukocytes, UA Large (3+) (*)    All other components within normal limits  URINE CULTURE  POCT URINE PREGNANCY    EKG   Radiology No results found.  Procedures Procedures (including critical care time)  Medications  Ordered in UC Medications  ondansetron  (  ZOFRAN -ODT) disintegrating tablet 4 mg (4 mg Oral Given 08/20/24 1024)    Initial Impression / Assessment and Plan / UC Course  I have reviewed the triage vital signs and the nursing notes.  Pertinent labs & imaging results that were available during my care of the patient were reviewed by me and considered in my medical decision making (see chart for details).   Patient is a pleasant 38 year old female presenting for evaluation of nausea, vomiting, diarrhea with decreased appetite that started 4 days ago.  At the same time she noticed that her eyes were yellowing.  She has scleral icterus bilaterally and she also has yellowing of her oral mucosa.  Her abdomen is soft and flat though her liver is palpable approximately 3 cm below the costal margin.  Patient symptoms are concerning for hepatitis.  She has previously been treated for hepatitis C in the [place] but indicated that she had been cleared of the virus.  She denies any other recent international travel.  A urine pregnancy test was ordered at triage as the patient has not had a period since 06/16/2024.  I have ordered 4 mg of Zofran  for the patient's nausea.  Urine pregnancy test negative.  Her urine was dark amber in color so I ordered a urine dip.  Urine dip showed brown color with 100 glucose, large bilirubin, moderate ketones, greater than 300 protein, greater than 8 urobilinogen, nitrite positive with large leukocyte esterase.  I will send urine for culture.  Given patient's scleral icterus and palpable liver with history of hepatitis C I feel she needs to be evaluated in the ER where she can have blood work to evaluate her liver function and possibly receive IV fluids.  She has elected to go to [facility].   Final Clinical Impressions(s) / UC Diagnoses   Final diagnoses:  Amenorrhea  Abnormal urine  Scleral icterus  Hepatomegaly  Nausea vomiting and diarrhea     Discharge  Instructions      Please go to the emergency department at [facility] to have your liver enzymes checked due to the fact that you have yellowing of your eyes and your liver is palpable below your rib cage which should not be.  This is especially concerning given your previous history of hepatitis C.     ED Prescriptions   None    PDMP not reviewed this encounter.    [1]  Social History Tobacco Use   Smoking status: Some Days    Current packs/day: 0.25    Types: Cigarettes    Passive exposure: Never   Smokeless tobacco: Never  Vaping Use   Vaping status: Never Used  Substance Use Topics   Alcohol use: Yes    Alcohol/week: 8.0 standard drinks of alcohol    Types: 8 Glasses of wine per week   Drug use: Never     Bernardino Ditch, NP 08/20/24 1035  "

## 2024-08-20 NOTE — ED Triage Notes (Signed)
 Pt c/o vomiting, nausea and loss of appetite. Started about 4 days ago. She states she has this episodically. She is concerned for a gut condition.

## 2024-08-20 NOTE — ED Notes (Signed)
 Unable to send urine culture due to lack of urine.
